# Patient Record
Sex: Female | Born: 1990 | Race: Black or African American | Hispanic: No | Marital: Single | Smoking: Current every day smoker
Health system: Southern US, Community
[De-identification: ages and names within clinical notes are randomized; demographics above are authoritative.]

## PROBLEM LIST (undated history)

## (undated) DIAGNOSIS — N921 Excessive and frequent menstruation with irregular cycle: Principal | ICD-10-CM

## (undated) DIAGNOSIS — Z8619 Personal history of other infectious and parasitic diseases: Secondary | ICD-10-CM

## (undated) DIAGNOSIS — Z3046 Encounter for surveillance of implantable subdermal contraceptive: Principal | ICD-10-CM

## (undated) DIAGNOSIS — Z309 Encounter for contraceptive management, unspecified: Secondary | ICD-10-CM

## (undated) HISTORY — DX: Encounter for contraceptive management, unspecified: Z30.9

## (undated) HISTORY — PX: WISDOM TOOTH EXTRACTION: SHX21

## (undated) HISTORY — DX: Excessive and frequent menstruation with irregular cycle: N92.1

## (undated) HISTORY — DX: Personal history of other infectious and parasitic diseases: Z86.19

## (undated) HISTORY — DX: Encounter for surveillance of implantable subdermal contraceptive: Z30.46

---
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
## 2013-11-28 ENCOUNTER — Emergency Department (HOSPITAL_COMMUNITY)
Admission: EM | Admit: 2013-11-28 | Discharge: 2013-11-28 | Disposition: A | Discharge status: Home / Self Care | Payer: 59 | Attending: Emergency Medicine | Admitting: Emergency Medicine

## 2013-11-28 ENCOUNTER — Encounter (HOSPITAL_COMMUNITY): Payer: Self-pay | Admitting: Emergency Medicine

## 2013-11-28 DIAGNOSIS — L259 Unspecified contact dermatitis, unspecified cause: Secondary | ICD-10-CM | POA: Insufficient documentation

## 2013-11-28 DIAGNOSIS — L309 Dermatitis, unspecified: Secondary | ICD-10-CM

## 2013-11-28 MED ORDER — PREDNISONE 10 MG PO TABS: ORAL_TABLET | ORAL | Status: DC

## 2013-11-28 MED ORDER — HYDROXYZINE HCL 25 MG PO TABS: 25.0000 mg | ORAL_TABLET | Freq: Four times a day (QID) | ORAL | Status: DC

## 2013-11-28 NOTE — ED Provider Notes (Signed)
CSN: [id number]     Arrival date & time 11/28/13  1648 History   First MD Initiated Contact with Patient 11/28/13 1737     Chief Complaint  Patient presents with  . Rash   (Consider location/radiation/quality/duration/timing/severity/associated sxs/prior Treatment) Patient is a 23 y.o. female presenting with rash. The history is provided by the patient.  Rash Location:  Face, head/neck, shoulder/arm and torso Head/neck rash location:  L neck and R neck Facial rash location:  Face Shoulder/arm rash location:  L axilla and R axilla Torso rash location:  L chest, R chest and upper back Quality: dryness, redness and scaling   Quality: not weeping   Severity:  Moderate Onset quality:  Gradual Duration:  3 weeks Timing:  Intermittent Progression:  Worsening Context: not chemical exposure, not hot tub use, not medications and not new detergent/soap   Relieved by:  Nothing Ineffective treatments:  None tried Associated symptoms: no abdominal pain, no joint pain, no shortness of breath and not wheezing     History reviewed. No pertinent past medical history. History reviewed. No pertinent past surgical history. Family History  Problem Relation Age of Onset  . Asthma Mother    History  Substance Use Topics  . Smoking status: Never Smoker   . Smokeless tobacco: Never Used  . Alcohol Use: No   OB History   Grav Para Term Preterm Abortions TAB SAB Ect Mult Living   1 1 1       1      Review of Systems  Constitutional: Negative for activity change.       All ROS Neg except as noted in HPI  HENT: Negative for nosebleeds.   Eyes: Negative for photophobia and discharge.  Respiratory: Negative for cough, shortness of breath and wheezing.   Cardiovascular: Negative for chest pain and palpitations.  Gastrointestinal: Negative for abdominal pain and blood in stool.  Genitourinary: Negative for dysuria, frequency and hematuria.  Musculoskeletal: Negative for arthralgias, back pain and  neck pain.  Skin: Positive for rash.  Neurological: Negative for dizziness, seizures and speech difficulty.  Psychiatric/Behavioral: Negative for hallucinations and confusion.    Allergies  Review of patient's allergies indicates no known allergies.  Home Medications  No current outpatient prescriptions on file. BP 121/71  Pulse 90  Temp(Src) 98 F (36.7 C)  Resp 20  Ht 5\' 6"  (1.676 m)  Wt 114 lb (51.71 kg)  BMI 18.41 kg/m2  SpO2 100% Physical Exam  Nursing note and vitals reviewed. Constitutional: She is oriented to person, place, and time. She appears well-developed and well-nourished.  Non-toxic appearance.  HENT:  Head: Normocephalic.  Right Ear: Tympanic membrane and external ear normal.  Left Ear: Tympanic membrane and external ear normal.  Eyes: EOM and lids are normal. Pupils are equal, round, and reactive to light.  Neck: Normal range of motion. Neck supple. Carotid bruit is not present.  Cardiovascular: Normal rate, regular rhythm, normal heart sounds, intact distal pulses and normal pulses.   Pulmonary/Chest: Breath sounds normal. No respiratory distress.  Abdominal: Soft. Bowel sounds are normal. There is no tenderness. There is no guarding.  Musculoskeletal: Normal range of motion.  Lymphadenopathy:       Head (right side): No submandibular adenopathy present.       Head (left side): No submandibular adenopathy present.    She has no cervical adenopathy.  Neurological: She is alert and oriented to person, place, and time. She has normal strength. No cranial nerve deficit or sensory deficit.  Skin: Skin is warm and dry.  There is a red dry scaling rash involving the antecubital areas of the right and left upper extremity. There is also a similar rash behind the left knee. There is also read dry scaling a rash involving the neck, chest, abdomen, and face. There is no red streaking appreciated. Is no hot areas appreciated. No drainage appreciated.  Psychiatric: She  has a normal mood and affect. Her speech is normal.    ED Course  Procedures (including critical care time) Labs Review Labs Reviewed - No data to display Imaging Review No results found.  EKG Interpretation   None       MDM  No diagnosis found. *I have reviewed nursing notes, vital signs, and all appropriate lab and imaging results for this patient.** Exam is consistent with eczema.  Rx for vistaril and prednisone given.  Pt referred to Dr hall for evaluation if not improving.  Kathie Dike, PA-C 11/29/13 [phone]

## 2013-11-28 NOTE — Discharge Instructions (Signed)
Please use the prednisone taper as prescribed. May use Vistaril at bedtime for itching, or every 6 hours if needed for more severe itching. Please see the dermatology specialist listed above if not improving. Eczema Eczema, also called atopic dermatitis, is a skin disorder that causes inflammation of the skin. It causes a red rash and dry, scaly skin. The skin becomes very itchy. Eczema is generally worse during the cooler winter months and often improves with the warmth of summer. Eczema usually starts showing signs in infancy. Some children outgrow eczema, but it may last through adulthood.  CAUSES  The exact cause of eczema is not known, but it appears to run in families. People with eczema often have a family history of eczema, allergies, asthma, or hay fever. Eczema is not contagious. Flare-ups of the condition may be caused by:   Contact with something you are sensitive or allergic to.   Stress. SIGNS AND SYMPTOMS  Dry, scaly skin.   Red, itchy rash.   Itchiness. This may occur before the skin rash and may be very intense.  DIAGNOSIS  The diagnosis of eczema is usually made based on symptoms and medical history. TREATMENT  Eczema cannot be cured, but symptoms usually can be controlled with treatment and other strategies. A treatment plan might include:  Controlling the itching and scratching.   Use over-the-counter antihistamines as directed for itching. This is especially useful at night when the itching tends to be worse.   Use over-the-counter steroid creams as directed for itching.   Avoid scratching. Scratching makes the rash and itching worse. It may also result in a skin infection (impetigo) due to a break in the skin caused by scratching.   Keeping the skin well moisturized with creams every day. This will seal in moisture and help prevent dryness. Lotions that contain alcohol and water should be avoided because they can dry the skin.   Limiting exposure to  things that you are sensitive or allergic to (allergens).   Recognizing situations that cause stress.   Developing a plan to manage stress.  HOME CARE INSTRUCTIONS   Only take over-the-counter or prescription medicines as directed by your health care provider.   Do not use anything on the skin without checking with your health care provider.   Keep baths or showers short (5 minutes) in warm (not hot) water. Use mild cleansers for bathing. These should be unscented. You may add nonperfumed bath oil to the bath water. It is best to avoid soap and bubble bath.   Immediately after a bath or shower, when the skin is still damp, apply a moisturizing ointment to the entire body. This ointment should be a petroleum ointment. This will seal in moisture and help prevent dryness. The thicker the ointment, the better. These should be unscented.   Keep fingernails cut short. Children with eczema may need to wear soft gloves or mittens at night after applying an ointment.   Dress in clothes made of cotton or cotton blends. Dress lightly, because heat increases itching.   A child with eczema should stay away from anyone with fever blisters or cold sores. The virus that causes fever blisters (herpes simplex) can cause a serious skin infection in children with eczema. SEEK MEDICAL CARE IF:   Your itching interferes with sleep.   Your rash gets worse or is not better within 1 week after starting treatment.   You see pus or soft yellow scabs in the rash area.   You have a  fever.   You have a rash flare-up after contact with someone who has fever blisters.  Document Released: 10/26/2000 Document Revised: 08/19/2013 Document Reviewed: 06/01/2013 [facility] Patient Information 2014 [place], [place].

## 2013-11-28 NOTE — ED Notes (Signed)
Rash on upper body and facial area for 3 weeks

## 2013-11-30 NOTE — ED Provider Notes (Signed)
Medical screening examination/treatment/procedure(s) were performed by non-physician practitioner and as supervising physician I was immediately available for consultation/collaboration.  EKG Interpretation   None        Zeppelin Commisso, MD 11/30/13 2229 

## 2014-06-14 ENCOUNTER — Telehealth: Payer: Self-pay | Admitting: Adult Health

## 2014-06-14 NOTE — Telephone Encounter (Signed)
Left message to make appt to be seen 

## 2014-06-14 NOTE — Telephone Encounter (Signed)
Pt states she had a Implanon inserted here in early 2013 and has had irregular bleeding every since then, she has now been on her period about 2 weeks and would like to know if we would RX a medication to help stop the bleeding.  Please advise.

## 2014-06-15 ENCOUNTER — Encounter: Payer: Self-pay | Admitting: Advanced Practice Midwife

## 2014-06-15 ENCOUNTER — Ambulatory Visit (INDEPENDENT_AMBULATORY_CARE_PROVIDER_SITE_OTHER): Payer: BC Managed Care – PPO | Admitting: Advanced Practice Midwife

## 2014-06-15 VITALS — BP 110/72 | Ht 66.0 in | Wt 105.0 lb

## 2014-06-15 DIAGNOSIS — N921 Excessive and frequent menstruation with irregular cycle: Secondary | ICD-10-CM

## 2014-06-15 DIAGNOSIS — Z309 Encounter for contraceptive management, unspecified: Secondary | ICD-10-CM

## 2014-06-15 DIAGNOSIS — Z975 Presence of (intrauterine) contraceptive device: Principal | ICD-10-CM

## 2014-06-15 MED ORDER — MEGESTROL ACETATE 40 MG PO TABS: ORAL_TABLET | ORAL | Status: DC

## 2014-06-15 NOTE — Progress Notes (Signed)
[facility] Visit  Patient name: Shelia Brown Witman MRN [id number]  Date of birth: 07/01/1991  CC & HPI:  Shelia Brown Krakowski is Brown 23 y.o. African American female presenting today for break through bleeding off and on since getting the Nexplanon 2 years ago. It is sometimes heavy, sometimes light.  Her friend takes megace prn and she would like that.  Pertinent History Reviewed:  Medical & Surgical Hx:   History reviewed. No pertinent past medical history. History reviewed. No pertinent past surgical history. Medications: Reviewed & Updated - see associated section Social History: Reviewed -  reports that she has been smoking Cigarettes.  She has been smoking about 0.00 packs per day. She has never used smokeless tobacco.  Objective Findings:  Vitals: BP 110/72  Ht 5\' 6"  (1.676 m)  Wt 105 lb (47.628 kg)  BMI 16.96 kg/m2  Physical Examination: General appearance - alert, well appearing, and in no distress Mental status - alert, oriented to person, place, and time  No results found for this or any previous visit (from the past 24 hour(s)).   Assessment & Plan:  Brown:   BTB on Nexplanon P:  Megace PRN   F/U prn   CRESENZO-DISHMAN,Knut Rondinelli CNM 06/15/2014 2:54 PM

## 2014-09-13 ENCOUNTER — Encounter: Payer: Self-pay | Admitting: Advanced Practice Midwife

## 2014-09-28 ENCOUNTER — Telehealth: Payer: Self-pay | Admitting: Adult Health

## 2014-09-28 NOTE — Telephone Encounter (Signed)
Having bleeding with nexplanon will come in 11/20 at 8:30 am

## 2014-09-28 NOTE — Telephone Encounter (Signed)
Pt states been taking the Megace for abnormal vaginal bleeding with the Nexplanon, now out of Megace and heavy vaginal bleeding started again 4 days ago. Pt states does she need to get another RX for the Megace or go ahead and have the Nexplanon removed, due to be removed in February (3 years since inserted).

## 2014-10-01 ENCOUNTER — Encounter: Payer: Self-pay | Admitting: Adult Health

## 2014-10-01 ENCOUNTER — Ambulatory Visit (INDEPENDENT_AMBULATORY_CARE_PROVIDER_SITE_OTHER): Payer: BC Managed Care – PPO | Admitting: Adult Health

## 2014-10-01 VITALS — BP 150/80 | Ht 66.0 in | Wt 112.0 lb

## 2014-10-01 DIAGNOSIS — Z975 Presence of (intrauterine) contraceptive device: Secondary | ICD-10-CM

## 2014-10-01 DIAGNOSIS — N921 Excessive and frequent menstruation with irregular cycle: Secondary | ICD-10-CM

## 2014-10-01 MED ORDER — MEGESTROL ACETATE 40 MG PO TABS
ORAL_TABLET | ORAL | Status: DC
Start: 2014-10-01 — End: 2015-01-06

## 2014-10-01 NOTE — Progress Notes (Signed)
Subjective:     Patient ID: Shelia Brown, female   DOB: [birth year], 22 y.o.   MRN: [id number]  HPI Shelia Brown is a 23 year old black female in complaining of bleeding with nexplanon, has had nexplanon since 2013.  Review of Systems See HPI Reviewed past medical,surgical, social and family history. Reviewed medications and allergies.     Objective:   Physical Exam BP 150/80 mmHg  Ht 5\' 6"  (1.676 m)  Wt 112 lb (50.803 kg)  BMI 18.09 kg/m2 Skin warm and dry.Pelvic: external genitalia is normal in appearance, vagina: period like blood, no odor cervix:smooth and bulbous, uterus: normal size, shape and contour, non tender, no masses felt, adnexa: no masses or tenderness noted.  GC/CHL obtained.    Discussed options not sure what she wants when time to remove nexplanon, will give megace to stop bleeding.  Assessment:     BTB with nexplanon    Plan:    Return in 3 months for nexplanon removal or before if has made choice GC/CHL sent Rx megace 40 mg #45 3 x 5 days then 2 x 5 days then 1 daily with 3 refills   Review handout on birth control options

## 2014-10-01 NOTE — Patient Instructions (Signed)
Take megace  follow up in 3 months for nexplanon

## 2014-10-02 LAB — GC/CHLAMYDIA PROBE AMP
CT Probe RNA: POSITIVE — AB
GC PROBE AMP APTIMA: NEGATIVE

## 2014-10-04 ENCOUNTER — Telehealth: Payer: Self-pay | Admitting: Adult Health

## 2014-10-04 NOTE — Telephone Encounter (Signed)
Left message to call about test results

## 2014-10-05 ENCOUNTER — Telehealth: Payer: Self-pay | Admitting: Adult Health

## 2014-10-05 MED ORDER — AZITHROMYCIN 500 MG PO TABS: ORAL_TABLET | ORAL | Status: DC

## 2014-10-05 NOTE — Telephone Encounter (Signed)
Pt aware of +CHlamydia, treated with azithromycin 500 mg #2 2 po now for pt and partner Nanda QuintonChris Dejounette was treated too, for POT 12/22 at 8:30 am [facility]DR[facility] sent

## 2014-11-02 ENCOUNTER — Ambulatory Visit: Payer: BC Managed Care – PPO | Admitting: Adult Health

## 2015-01-06 ENCOUNTER — Encounter: Payer: Self-pay | Admitting: *Deleted

## 2015-01-06 ENCOUNTER — Ambulatory Visit (INDEPENDENT_AMBULATORY_CARE_PROVIDER_SITE_OTHER): Payer: BLUE CROSS/BLUE SHIELD | Admitting: Adult Health

## 2015-01-06 ENCOUNTER — Encounter: Payer: Self-pay | Admitting: Adult Health

## 2015-01-06 ENCOUNTER — Ambulatory Visit (INDEPENDENT_AMBULATORY_CARE_PROVIDER_SITE_OTHER): Payer: BLUE CROSS/BLUE SHIELD | Admitting: *Deleted

## 2015-01-06 VITALS — BP 118/80 | HR 90 | Ht 66.0 in | Wt 112.0 lb

## 2015-01-06 DIAGNOSIS — Z309 Encounter for contraceptive management, unspecified: Secondary | ICD-10-CM

## 2015-01-06 DIAGNOSIS — Z30013 Encounter for initial prescription of injectable contraceptive: Secondary | ICD-10-CM

## 2015-01-06 DIAGNOSIS — Z3049 Encounter for surveillance of other contraceptives: Secondary | ICD-10-CM

## 2015-01-06 DIAGNOSIS — Z3042 Encounter for surveillance of injectable contraceptive: Secondary | ICD-10-CM

## 2015-01-06 DIAGNOSIS — Z3046 Encounter for surveillance of implantable subdermal contraceptive: Secondary | ICD-10-CM

## 2015-01-06 DIAGNOSIS — Z3202 Encounter for pregnancy test, result negative: Secondary | ICD-10-CM

## 2015-01-06 DIAGNOSIS — Z8619 Personal history of other infectious and parasitic diseases: Secondary | ICD-10-CM

## 2015-01-06 HISTORY — DX: Encounter for surveillance of implantable subdermal contraceptive: Z30.46

## 2015-01-06 HISTORY — DX: Encounter for contraceptive management, unspecified: Z30.9

## 2015-01-06 HISTORY — DX: Personal history of other infectious and parasitic diseases: Z86.19

## 2015-01-06 LAB — POCT URINE PREGNANCY: PREG TEST UR: NEGATIVE

## 2015-01-06 MED ORDER — MEDROXYPROGESTERONE ACETATE 150 MG/ML IM SUSP
150.0000 mg | Freq: Once | INTRAMUSCULAR | Status: AC
  Administered 2015-01-06: 150 mg via INTRAMUSCULAR

## 2015-01-06 MED ORDER — MEDROXYPROGESTERONE ACETATE 150 MG/ML IM SUSP: 150.0000 mg | INTRAMUSCULAR | Status: DC

## 2015-01-06 NOTE — Progress Notes (Addendum)
Subjective:     Patient ID: Shelia Brown, female   DOB: 1990-12-04, 24 y.o.   MRN: [id number]  HPI Rolla EtienneBeate is a 24 year old black female in for nexplanon removal and proof of treatment for chlamydia and to talk birth control and she wants depo provera.She took meds for Chlamydia and has not had sex since November and actually broke up with boyfriend.  Review of Systems Patient denies any headaches, hearing loss, fatigue, blurred vision, shortness of breath, chest pain, abdominal pain, problems with bowel movements, urination, or intercourse. No joint pain or mood swings. Reviewed past medical,surgical, social and family history. Reviewed medications and allergies.     Objective:   Physical Exam BP 118/80 mmHg  Pulse 90  Ht 5\' 6"  (1.676 m)  Wt 112 lb (50.803 kg)  BMI 18.09 kg/m2  LMP 12/30/2014 UPT negative, consent signed, time out called, and left arm cleansed with betadine, and injected with 1.5 cc 2% lidocaine and waited til numb.Under sterile technique a #11 blade was used to make small vertical incision, and a curved forceps was used to easily remove rod. Steri strips applied. Pressure dressing applied.   GC/CHL sent on urine for proof of treatment. Discussed depo provera and she wants to get today, face time 15 minutes with pt with 50% taking about depo and chlamydia.  Assessment:     Nexplanon removed Proof of treatment recent Chlamydia Contraceptive management    Plan:     GC/CHL sent Rx Depo provera 150 mg, disp.# 1 vail for IM injection every 3 months in office with 4 refills, go get and bring to office this pm for first injection, review handout on depo Use condoms, keep clean and dry x 24 hours, no heavy lifting, keep steri strips on x 72 hours, Keep pressure dressing on x 24 hours.  Follow up prn problems.

## 2015-01-06 NOTE — Patient Instructions (Signed)
Use condoms x 2 weeks, keep clean and dry x 24 hours, no heavy lifting, keep steri strips on x 72 hours, Keep pressure dressing on x 24 hours. Follow up prn problems. Go get depo and return  Medroxyprogesterone injection [Contraceptive] What is this medicine? MEDROXYPROGESTERONE (me DROX ee proe JES te rone) contraceptive injections prevent pregnancy. They provide effective birth control for 3 months. Depo-subQ Provera 104 is also used for treating pain related to endometriosis. This medicine may be used for other purposes; ask your health care provider or pharmacist if you have questions. COMMON BRAND NAME(S): Depo-Provera, Depo-subQ Provera 104 What should I tell my health care provider before I take this medicine? They need to know if you have any of these conditions: -frequently drink alcohol -asthma -blood vessel disease or a history of a blood clot in the lungs or legs -bone disease such as osteoporosis -breast cancer -diabetes -eating disorder (anorexia nervosa or bulimia) -high blood pressure -HIV infection or AIDS -kidney disease -liver disease -mental depression -migraine -seizures (convulsions) -stroke -tobacco smoker -vaginal bleeding -an unusual or allergic reaction to medroxyprogesterone, other hormones, medicines, foods, dyes, or preservatives -pregnant or trying to get pregnant -breast-feeding How should I use this medicine? Depo-Provera Contraceptive injection is given into a muscle. Depo-subQ Provera 104 injection is given under the skin. These injections are given by a health care professional. You must not be pregnant before getting an injection. The injection is usually given during the first 5 days after the start of a menstrual period or 6 weeks after delivery of a baby. Talk to your pediatrician regarding the use of this medicine in children. Special care may be needed. These injections have been used in female children who have started having menstrual  periods. Overdosage: If you think you have taken too much of this medicine contact a poison control center or emergency room at once. NOTE: This medicine is only for you. Do not share this medicine with others. What if I miss a dose? Try not to miss a dose. You must get an injection once every 3 months to maintain birth control. If you cannot keep an appointment, call and reschedule it. If you wait longer than 13 weeks between Depo-Provera contraceptive injections or longer than 14 weeks between Depo-subQ Provera 104 injections, you could get pregnant. Use another method for birth control if you miss your appointment. You may also need a pregnancy test before receiving another injection. What may interact with this medicine? Do not take this medicine with any of the following medications: -bosentan This medicine may also interact with the following medications: -aminoglutethimide -antibiotics or medicines for infections, especially rifampin, rifabutin, rifapentine, and griseofulvin -aprepitant -barbiturate medicines such as phenobarbital or primidone -bexarotene -carbamazepine -medicines for seizures like ethotoin, felbamate, oxcarbazepine, phenytoin, topiramate -modafinil -St. John's wort This list may not describe all possible interactions. Give your health care provider a list of all the medicines, herbs, non-prescription drugs, or dietary supplements you use. Also tell them if you smoke, drink alcohol, or use illegal drugs. Some items may interact with your medicine. What should I watch for while using this medicine? This drug does not protect you against HIV infection (AIDS) or other sexually transmitted diseases. Use of this product may cause you to lose calcium from your bones. Loss of calcium may cause weak bones (osteoporosis). Only use this product for more than 2 years if other forms of birth control are not right for you. The longer you use this product for birth  control the more  likely you will be at risk for weak bones. Ask your health care professional how you can keep strong bones. You may have a change in bleeding pattern or irregular periods. Many females stop having periods while taking this drug. If you have received your injections on time, your chance of being pregnant is very low. If you think you may be pregnant, see your health care professional as soon as possible. Tell your health care professional if you want to get pregnant within the next year. The effect of this medicine may last a long time after you get your last injection. What side effects may I notice from receiving this medicine? Side effects that you should report to your doctor or health care professional as soon as possible: -allergic reactions like skin rash, itching or hives, swelling of the face, lips, or tongue -breast tenderness or discharge -breathing problems -changes in vision -depression -feeling faint or lightheaded, falls -fever -pain in the abdomen, chest, groin, or leg -problems with balance, talking, walking -unusually weak or tired -yellowing of the eyes or skin Side effects that usually do not require medical attention (report to your doctor or health care professional if they continue or are bothersome): -acne -fluid retention and swelling -headache -irregular periods, spotting, or absent periods -temporary pain, itching, or skin reaction at site where injected -weight gain This list may not describe all possible side effects. Call your doctor for medical advice about side effects. You may report side effects to FDA at 1-800-FDA-1088. Where should I keep my medicine? This does not apply. The injection will be given to you by a health care professional. NOTE: This sheet is a summary. It may not cover all possible information. If you have questions about this medicine, talk to your doctor, pharmacist, or health care provider.  2015, Elsevier/Gold Standard. (2008-11-19  18:37:56)

## 2015-01-06 NOTE — Progress Notes (Signed)
Pt here for Depo shot. Pt had Implanon removed earlier today and is having pain in that arm. Advised it should get better, but if not, call us back. Pt had negative urine pregnancy test at earlier appt.  Return in 12 weeks for next shot. JSY

## 2015-01-07 LAB — GC/CHLAMYDIA PROBE AMP
CHLAMYDIA, DNA PROBE: NEGATIVE
Neisseria gonorrhoeae by PCR: NEGATIVE

## 2015-03-16 ENCOUNTER — Other Ambulatory Visit: Payer: Self-pay | Admitting: Adult Health

## 2015-03-28 ENCOUNTER — Ambulatory Visit: Payer: BLUE CROSS/BLUE SHIELD | Admitting: Adult Health

## 2015-03-31 ENCOUNTER — Ambulatory Visit: Payer: BLUE CROSS/BLUE SHIELD

## 2015-03-31 ENCOUNTER — Encounter: Payer: Self-pay | Admitting: Adult Health

## 2015-03-31 ENCOUNTER — Ambulatory Visit (INDEPENDENT_AMBULATORY_CARE_PROVIDER_SITE_OTHER): Payer: BLUE CROSS/BLUE SHIELD | Admitting: Adult Health

## 2015-03-31 VITALS — BP 102/60 | HR 80 | Ht 66.0 in | Wt 109.5 lb

## 2015-03-31 DIAGNOSIS — Z3202 Encounter for pregnancy test, result negative: Secondary | ICD-10-CM

## 2015-03-31 DIAGNOSIS — N921 Excessive and frequent menstruation with irregular cycle: Secondary | ICD-10-CM | POA: Diagnosis not present

## 2015-03-31 DIAGNOSIS — Z3042 Encounter for surveillance of injectable contraceptive: Secondary | ICD-10-CM | POA: Diagnosis not present

## 2015-03-31 HISTORY — DX: Excessive and frequent menstruation with irregular cycle: N92.1

## 2015-03-31 LAB — POCT URINE PREGNANCY: Preg Test, Ur: NEGATIVE

## 2015-03-31 MED ORDER — MEGESTROL ACETATE 40 MG PO TABS: 40.0000 mg | ORAL_TABLET | Freq: Every day | ORAL | Status: DC

## 2015-03-31 MED ORDER — MEDROXYPROGESTERONE ACETATE 150 MG/ML IM SUSP
150.0000 mg | Freq: Once | INTRAMUSCULAR | Status: AC
  Administered 2015-03-31: 150 mg via INTRAMUSCULAR

## 2015-03-31 NOTE — Progress Notes (Signed)
Subjective:     Patient ID: Georgiann CockerBeate A Madry, female   DOB: 1990/12/17, 24 y.o.   MRN: [id number]  HPI Rolla EtienneBeate is a 24 year old black female in complaining of irregular bleeding with depo, she had with nexplanon too.If she takes megace it stops, and she needs refills.No sex in > 6 months,had negative GC/CHL in February.  Review of Systems + irregular bleeding, all other systems negative Reviewed past medical,surgical, social and family history. Reviewed medications and allergies.     Objective:   Physical Exam BP 102/60 mmHg  Pulse 80  Ht 5\' 6"  (1.676 m)  Wt 109 lb 8 oz (49.669 kg)  BMI 17.68 kg/m2   UPT negative, Talk only, not unusual to have irregular bleeding with depo, will give depo today and will Rx megace. Depo 150 mg IM by Eligah EastJ Young LPN.  Assessment:     Irregular bleeding with depo Contraceptive management    Plan:    Use condoms if has sex Rx megace 40 mg #30 1 daily to stop bleeding, with 3 refills Return in 12 Weeks for next depo

## 2015-03-31 NOTE — Patient Instructions (Signed)
Depo in 12 weeks  Take megace 1 daily

## 2015-06-23 ENCOUNTER — Ambulatory Visit: Payer: BLUE CROSS/BLUE SHIELD

## 2015-06-23 ENCOUNTER — Encounter: Payer: Self-pay | Admitting: Obstetrics & Gynecology

## 2016-01-30 ENCOUNTER — Other Ambulatory Visit: Payer: Self-pay | Admitting: Adult Health

## 2016-01-31 ENCOUNTER — Other Ambulatory Visit (HOSPITAL_COMMUNITY)
Admission: RE | Admit: 2016-01-31 | Discharge: 2016-01-31 | Disposition: A | Discharge status: Home / Self Care | Payer: BLUE CROSS/BLUE SHIELD | Source: Ambulatory Visit | Attending: Adult Health | Admitting: Adult Health

## 2016-01-31 ENCOUNTER — Encounter: Payer: Self-pay | Admitting: Adult Health

## 2016-01-31 ENCOUNTER — Ambulatory Visit (INDEPENDENT_AMBULATORY_CARE_PROVIDER_SITE_OTHER): Payer: BLUE CROSS/BLUE SHIELD | Admitting: Adult Health

## 2016-01-31 VITALS — BP 100/78 | HR 84 | Ht 66.5 in | Wt 106.0 lb

## 2016-01-31 DIAGNOSIS — Z113 Encounter for screening for infections with a predominantly sexual mode of transmission: Secondary | ICD-10-CM | POA: Insufficient documentation

## 2016-01-31 DIAGNOSIS — Z01419 Encounter for gynecological examination (general) (routine) without abnormal findings: Secondary | ICD-10-CM | POA: Insufficient documentation

## 2016-01-31 DIAGNOSIS — Z30013 Encounter for initial prescription of injectable contraceptive: Secondary | ICD-10-CM

## 2016-01-31 DIAGNOSIS — Z3202 Encounter for pregnancy test, result negative: Secondary | ICD-10-CM | POA: Diagnosis not present

## 2016-01-31 LAB — POCT URINE PREGNANCY: Preg Test, Ur: NEGATIVE

## 2016-01-31 MED ORDER — MEDROXYPROGESTERONE ACETATE 150 MG/ML IM SUSP: INTRAMUSCULAR | Status: DC

## 2016-01-31 NOTE — Patient Instructions (Signed)
No sex  Call with period for first depo Physical in 1 year  Labs in near future

## 2016-01-31 NOTE — Progress Notes (Signed)
Patient ID: Georgiann CockerBeate A Hamberger, female   DOB: [birth year], 25 y.o.   MRN: [id number] History of Present Illness: Rolla EtienneBeate is a 25 year old black female, in for well woman gyn exam,and pap.She has been off depo since May and wants to resume it.Last sex in January. 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 She has some BTB at times and takes megace.  Current Medications, Allergies, Past Medical History, Past Surgical History, Family History and Social History were reviewed in [facility] electronic medical record.     Review of Systems: Patient denies any headaches, hearing loss, fatigue, blurred vision, shortness of breath, chest pain, abdominal pain, problems with bowel movements, urination, or intercourse. No joint pain or mood swings.See HPI for positives.    Physical Exam:BP 100/78 mmHg  Pulse 84  Ht 5' 6.5" (1.689 m)  Wt 106 lb (48.081 kg)  BMI 16.85 kg/m2  LMP 01/15/2016 (Approximate) UPT negative. General:  Well developed, well nourished, no acute distress Skin:  Warm and dry, has several tattoos Neck:  Midline trachea, normal thyroid, good ROM, no lymphadenopathy Lungs; Clear to auscultation bilaterally Breast:  No dominant palpable mass, retraction, or nipple discharge Cardiovascular: Regular rate and rhythm Abdomen:  Soft, non tender, no hepatosplenomegaly Pelvic:  External genitalia is normal in appearance, no lesions.  The vagina is normal in appearance. Urethra has no lesions or masses. The cervix is smooth, pap with G/CHL perfomred.  Uterus is felt to be normal size, shape, and contour.  No adnexal masses or tenderness noted.Bladder is non tender, no masses felt. Extremities/musculoskeletal:  No swelling or varicosities noted, no clubbing or cyanosis Psych:  No mood changes, alert and cooperative,seems happy   Impression: Well woman gyn exam and pap Contraceptive management    Plan:  No sex. Call with next period for first depo Rx Depo provera 150 mg, disp.# 1 vail for IM  injection every 3 months in office, with 4 refills Physical in 1 year, pap in 3 if normal Labs in near future

## 2016-02-02 LAB — CYTOLOGY - PAP

## 2016-02-03 ENCOUNTER — Encounter: Payer: Self-pay | Admitting: *Deleted

## 2016-02-03 ENCOUNTER — Ambulatory Visit (INDEPENDENT_AMBULATORY_CARE_PROVIDER_SITE_OTHER): Payer: BLUE CROSS/BLUE SHIELD | Admitting: *Deleted

## 2016-02-03 DIAGNOSIS — Z3202 Encounter for pregnancy test, result negative: Secondary | ICD-10-CM

## 2016-02-03 DIAGNOSIS — Z3042 Encounter for surveillance of injectable contraceptive: Secondary | ICD-10-CM | POA: Diagnosis not present

## 2016-02-03 LAB — POCT URINE PREGNANCY: PREG TEST UR: NEGATIVE

## 2016-02-03 MED ORDER — MEDROXYPROGESTERONE ACETATE 150 MG/ML IM SUSP
150.0000 mg | Freq: Once | INTRAMUSCULAR | Status: AC
  Administered 2016-02-03: 150 mg via INTRAMUSCULAR

## 2016-02-03 NOTE — Progress Notes (Signed)
Patient ID: Shelia Brown, female   DOB: 09-Feb-1991, 25 y.o.   MRN: [id number] Depo Provera 150 mg IM given left deltoid with no complication. Pregnancy test negative and pt states started her period this week. Pt to return in 12 weeks for next injection.

## 2016-04-17 ENCOUNTER — Ambulatory Visit: Payer: BLUE CROSS/BLUE SHIELD

## 2016-04-27 ENCOUNTER — Ambulatory Visit (INDEPENDENT_AMBULATORY_CARE_PROVIDER_SITE_OTHER): Payer: BLUE CROSS/BLUE SHIELD | Admitting: *Deleted

## 2016-04-27 ENCOUNTER — Encounter: Payer: Self-pay | Admitting: *Deleted

## 2016-04-27 DIAGNOSIS — Z3202 Encounter for pregnancy test, result negative: Secondary | ICD-10-CM | POA: Diagnosis not present

## 2016-04-27 DIAGNOSIS — Z3042 Encounter for surveillance of injectable contraceptive: Secondary | ICD-10-CM

## 2016-04-27 LAB — POCT URINE PREGNANCY: Preg Test, Ur: NEGATIVE

## 2016-04-27 MED ORDER — MEDROXYPROGESTERONE ACETATE 150 MG/ML IM SUSP
150.0000 mg | Freq: Once | INTRAMUSCULAR | Status: AC
  Administered 2016-04-27: 150 mg via INTRAMUSCULAR

## 2016-04-27 NOTE — Progress Notes (Signed)
Patient ID: Shelia Brown, female   DOB: 02/14/1991, 25 y.o.   MRN: [id number] Depo Provera 150 mg IM injection given in right deltoid with no complications, negative pregnancy test. Pt to return in 12 weeks for next injection.

## 2016-07-20 ENCOUNTER — Ambulatory Visit: Payer: BLUE CROSS/BLUE SHIELD

## 2016-08-17 ENCOUNTER — Ambulatory Visit (INDEPENDENT_AMBULATORY_CARE_PROVIDER_SITE_OTHER): Payer: BLUE CROSS/BLUE SHIELD | Admitting: *Deleted

## 2016-08-17 ENCOUNTER — Encounter: Payer: Self-pay | Admitting: *Deleted

## 2016-08-17 DIAGNOSIS — Z3202 Encounter for pregnancy test, result negative: Secondary | ICD-10-CM

## 2016-08-17 DIAGNOSIS — Z308 Encounter for other contraceptive management: Secondary | ICD-10-CM

## 2016-08-17 LAB — POCT URINE PREGNANCY: PREG TEST UR: NEGATIVE

## 2016-08-17 MED ORDER — MEDROXYPROGESTERONE ACETATE 150 MG/ML IM SUSP: 150.0000 mg | Freq: Once | INTRAMUSCULAR | Status: DC

## 2016-08-17 NOTE — Progress Notes (Signed)
Pt here for Depo. Pt's last Depo was 04/27/16. Pt's Depo was due 9/1-9/15. Pt had sex last pm. I advised she can't have Depo today. Needs to have STAT quant in 10 days and as long as negative, can have Depo same day. NO SEX x 10 days!! Pt voiced understanding. JSY

## 2016-08-27 ENCOUNTER — Ambulatory Visit (INDEPENDENT_AMBULATORY_CARE_PROVIDER_SITE_OTHER): Payer: BLUE CROSS/BLUE SHIELD | Admitting: *Deleted

## 2016-08-27 ENCOUNTER — Other Ambulatory Visit: Payer: BLUE CROSS/BLUE SHIELD

## 2016-08-27 ENCOUNTER — Encounter: Payer: Self-pay | Admitting: *Deleted

## 2016-08-27 DIAGNOSIS — Z3042 Encounter for surveillance of injectable contraceptive: Secondary | ICD-10-CM

## 2016-08-27 DIAGNOSIS — Z3202 Encounter for pregnancy test, result negative: Secondary | ICD-10-CM | POA: Diagnosis not present

## 2016-08-27 DIAGNOSIS — Z308 Encounter for other contraceptive management: Secondary | ICD-10-CM

## 2016-08-27 LAB — POCT URINE PREGNANCY: Preg Test, Ur: NEGATIVE

## 2016-08-27 MED ORDER — MEDROXYPROGESTERONE ACETATE 150 MG/ML IM SUSP
150.0000 mg | Freq: Once | INTRAMUSCULAR | Status: AC
  Administered 2016-08-27: 150 mg via INTRAMUSCULAR

## 2016-08-27 NOTE — Progress Notes (Signed)
Depo Provera 150 mg IM given left deltoid with no complications, negative pregnancy test. Pt states started her period on Friday, 08/24/2016, late for Depo, ok to give per Genelle GatherJennifer Griffin,NP. Pt to return in 12 weeks for next injection.

## 2016-11-26 ENCOUNTER — Ambulatory Visit: Payer: BLUE CROSS/BLUE SHIELD

## 2017-08-26 ENCOUNTER — Encounter: Payer: Self-pay | Admitting: Adult Health

## 2017-08-26 ENCOUNTER — Ambulatory Visit (INDEPENDENT_AMBULATORY_CARE_PROVIDER_SITE_OTHER): Payer: BLUE CROSS/BLUE SHIELD | Admitting: Adult Health

## 2017-08-26 VITALS — BP 112/68 | HR 77 | Resp 18 | Ht 66.0 in | Wt 113.0 lb

## 2017-08-26 DIAGNOSIS — Z3A01 Less than 8 weeks gestation of pregnancy: Secondary | ICD-10-CM | POA: Insufficient documentation

## 2017-08-26 DIAGNOSIS — Z3201 Encounter for pregnancy test, result positive: Secondary | ICD-10-CM

## 2017-08-26 DIAGNOSIS — R112 Nausea with vomiting, unspecified: Secondary | ICD-10-CM | POA: Diagnosis not present

## 2017-08-26 DIAGNOSIS — N926 Irregular menstruation, unspecified: Secondary | ICD-10-CM | POA: Diagnosis not present

## 2017-08-26 DIAGNOSIS — O3680X0 Pregnancy with inconclusive fetal viability, not applicable or unspecified: Secondary | ICD-10-CM

## 2017-08-26 DIAGNOSIS — R197 Diarrhea, unspecified: Secondary | ICD-10-CM | POA: Diagnosis not present

## 2017-08-26 DIAGNOSIS — O219 Vomiting of pregnancy, unspecified: Secondary | ICD-10-CM | POA: Insufficient documentation

## 2017-08-26 LAB — POCT URINE PREGNANCY: PREG TEST UR: POSITIVE — AB

## 2017-08-26 MED ORDER — DOXYLAMINE-PYRIDOXINE ER 20-20 MG PO TBCR: 1.0000 | EXTENDED_RELEASE_TABLET | Freq: Two times a day (BID) | ORAL | 0 refills | Status: DC

## 2017-08-26 MED ORDER — OB COMPLETE PETITE 35-5-1-200 MG PO CAPS: ORAL_CAPSULE | ORAL | 12 refills | Status: DC

## 2017-08-26 NOTE — Progress Notes (Signed)
Subjective:     Patient ID: Shelia Brown, female   DOB: Mar 28, 1991, 26 y.o.   MRN: [id number]  HPI Shelia Brown is a 26 year old white female in for UPT, has missed a period and has nausea and vomiting, some cramps and diarrhea.   Review of Systems +missed period  +nausea and vomiting +cramps and diarrhea  Reviewed past medical,surgical, social and family history. Reviewed medications and allergies.     Objective:   Physical Exam BP 112/68 (BP Location: Left Arm, Patient Position: Sitting, Cuff Size: Normal)   Pulse 77   Resp 18   Ht  (1.676 m)   Wt 113 lb (51.3 kg)   LMP 07/13/2017   SpO2 99%   BMI 18.24 kg/m UPT +, about 6+2 weeks, by LMP with EDD 04/19/18. Skin warm and dry. Neck: mid line trachea, normal thyroid, good ROM, no lymphadenopathy noted. Lungs: clear to ausculation bilaterally. Cardiovascular: regular rate and rhythm.Abdomen is soft, slightly tender.    Assessment:     1. Pregnancy test positive   2. Less than [redacted] weeks gestation of pregnancy   3. Encounter to determine fetal viability of pregnancy, single or unspecified fetus   4. Nausea and vomiting during pregnancy prior to [redacted] weeks gestation       Plan:     Meds ordered this encounter  Medications  . Prenat-FeCbn-FeAspGl-FA-Omega (OB COMPLETE PETITE) 35-5-1-200 MG CAPS    Sig: Take 1 daily    Dispense:  30 capsule    Refill:  12    Order Specific Question:   Supervising Provider    Answer:   Despina Hidden, LUTHER H [[id number]]  . Doxylamine-Pyridoxine ER (BONJESTA) 20-20 MG TBCR    Sig: Take 1 capsule by mouth 2 (two) times daily at 8 am and 10 pm.    Dispense:  18 tablet    Refill:  0    Order Specific Question:   Supervising Provider    Answer:   Duane Lope H [[id number]]  LOT [id number], exp 11-11-17. Return in 1 week for dating [place] Eat often Review handouts on First trimester and by [facility]    Can take imodium if needed

## 2017-08-26 NOTE — Patient Instructions (Signed)
First Trimester of Pregnancy The first trimester of pregnancy is from week 1 until the end of week 13 (months 1 through 3). During this time, your baby will begin to develop inside you. At 6-8 weeks, the eyes and face are formed, and the heartbeat can be seen on ultrasound. At the end of 12 weeks, all the baby's organs are formed. Prenatal care is all the medical care you receive before the birth of your baby. Make sure you get good prenatal care and follow all of your doctor's instructions. Follow these instructions at home: Medicines  Take over-the-counter and prescription medicines only as told by your doctor. Some medicines are safe and some medicines are not safe during pregnancy.  Take a prenatal vitamin that contains at least 600 micrograms (mcg) of folic acid.  If you have trouble pooping (constipation), take medicine that will make your stool soft (stool softener) if your doctor approves. Eating and drinking  Eat regular, healthy meals.  Your doctor will tell you the amount of weight gain that is right for you.  Avoid raw meat and uncooked cheese.  If you feel sick to your stomach (nauseous) or throw up (vomit): ? Eat 4 or 5 small meals a day instead of 3 large meals. ? Try eating a few soda crackers. ? Drink liquids between meals instead of during meals.  To prevent constipation: ? Eat foods that are high in fiber, like fresh fruits and vegetables, whole grains, and beans. ? Drink enough fluids to keep your pee (urine) clear or pale yellow. Activity  Exercise only as told by your doctor. Stop exercising if you have cramps or pain in your lower belly (abdomen) or low back.  Do not exercise if it is too hot, too humid, or if you are in a place of great height (high altitude).  Try to avoid standing for long periods of time. Move your legs often if you must stand in one place for a long time.  Avoid heavy lifting.  Wear low-heeled shoes. Sit and stand up straight.  You  can have sex unless your doctor tells you not to. Relieving pain and discomfort  Wear a good support bra if your breasts are sore.  Take warm water baths (sitz baths) to soothe pain or discomfort caused by hemorrhoids. Use hemorrhoid cream if your doctor says it is okay.  Rest with your legs raised if you have leg cramps or low back pain.  If you have puffy, bulging veins (varicose veins) in your legs: ? Wear support hose or compression stockings as told by your doctor. ? Raise (elevate) your feet for 15 minutes, 3-4 times a day. ? Limit salt in your food. Prenatal care  Schedule your prenatal visits by the twelfth week of pregnancy.  Write down your questions. Take them to your prenatal visits.  Keep all your prenatal visits as told by your doctor. This is important. Safety  Wear your seat belt at all times when driving.  Make a list of emergency phone numbers. The list should include numbers for family, friends, the hospital, and police and fire departments. General instructions  Ask your doctor for a referral to a local prenatal class. Begin classes no later than at the start of month 6 of your pregnancy.  Ask for help if you need counseling or if you need help with nutrition. Your doctor can give you advice or tell you where to go for help.  Do not use hot tubs, steam rooms, or   saunas.  Do not douche or use tampons or scented sanitary pads.  Do not cross your legs for long periods of time.  Avoid all herbs and alcohol. Avoid drugs that are not approved by your doctor.  Do not use any tobacco products, including cigarettes, chewing tobacco, and electronic cigarettes. If you need help quitting, ask your doctor. You may get counseling or other support to help you quit.  Avoid cat litter boxes and soil used by cats. These carry germs that can cause birth defects in the baby and can cause a loss of your baby (miscarriage) or stillbirth.  Visit your dentist. At home, brush  your teeth with a soft toothbrush. Be gentle when you floss. Contact a doctor if:  You are dizzy.  You have mild cramps or pressure in your lower belly.  You have a nagging pain in your belly area.  You continue to feel sick to your stomach, you throw up, or you have watery poop (diarrhea).  You have a bad smelling fluid coming from your vagina.  You have pain when you pee (urinate).  You have increased puffiness (swelling) in your face, hands, legs, or ankles. Get help right away if:  You have a fever.  You are leaking fluid from your vagina.  You have spotting or bleeding from your vagina.  You have very bad belly cramping or pain.  You gain or lose weight rapidly.  You throw up blood. It may look like coffee grounds.  You are around people who have German measles, fifth disease, or chickenpox.  You have a very bad headache.  You have shortness of breath.  You have any kind of trauma, such as from a fall or a car accident. Summary  The first trimester of pregnancy is from week 1 until the end of week 13 (months 1 through 3).  To take care of yourself and your unborn baby, you will need to eat healthy meals, take medicines only if your doctor tells you to do so, and do activities that are safe for you and your baby.  Keep all follow-up visits as told by your doctor. This is important as your doctor will have to ensure that your baby is healthy and growing well. This information is not intended to replace advice given to you by your health care provider. Make sure you discuss any questions you have with your health care provider. Document Released: 04/16/2008 Document Revised: 11/06/2016 Document Reviewed: 11/06/2016 Elsevier Interactive Patient Education  2017 [employer].  

## 2017-09-02 ENCOUNTER — Ambulatory Visit (INDEPENDENT_AMBULATORY_CARE_PROVIDER_SITE_OTHER): Payer: BLUE CROSS/BLUE SHIELD

## 2017-09-02 DIAGNOSIS — O3680X0 Pregnancy with inconclusive fetal viability, not applicable or unspecified: Secondary | ICD-10-CM

## 2017-09-02 DIAGNOSIS — Z3A01 Less than 8 weeks gestation of pregnancy: Secondary | ICD-10-CM

## 2017-09-02 NOTE — Progress Notes (Signed)
US 7+2 wks,single IUP w/ys,positive fht 136 bpm,crl 10.27 mm,normal ovaries bilat,EDD 04/19/2018 by LMP

## 2017-09-16 ENCOUNTER — Encounter: Payer: BLUE CROSS/BLUE SHIELD | Admitting: Women's Health

## 2017-09-16 ENCOUNTER — Ambulatory Visit: Payer: BLUE CROSS/BLUE SHIELD | Admitting: *Deleted

## 2017-09-25 ENCOUNTER — Ambulatory Visit: Payer: BLUE CROSS/BLUE SHIELD | Admitting: *Deleted

## 2017-09-25 ENCOUNTER — Ambulatory Visit (INDEPENDENT_AMBULATORY_CARE_PROVIDER_SITE_OTHER): Payer: BLUE CROSS/BLUE SHIELD | Admitting: Women's Health

## 2017-09-25 ENCOUNTER — Encounter: Payer: Self-pay | Admitting: Women's Health

## 2017-09-25 VITALS — BP 120/62 | HR 74 | Wt 114.0 lb

## 2017-09-25 DIAGNOSIS — Z3481 Encounter for supervision of other normal pregnancy, first trimester: Secondary | ICD-10-CM | POA: Diagnosis not present

## 2017-09-25 DIAGNOSIS — Z3A10 10 weeks gestation of pregnancy: Secondary | ICD-10-CM | POA: Diagnosis not present

## 2017-09-25 DIAGNOSIS — F1290 Cannabis use, unspecified, uncomplicated: Secondary | ICD-10-CM | POA: Insufficient documentation

## 2017-09-25 DIAGNOSIS — Z331 Pregnant state, incidental: Secondary | ICD-10-CM

## 2017-09-25 DIAGNOSIS — Z3490 Encounter for supervision of normal pregnancy, unspecified, unspecified trimester: Secondary | ICD-10-CM | POA: Insufficient documentation

## 2017-09-25 DIAGNOSIS — Z1389 Encounter for screening for other disorder: Secondary | ICD-10-CM

## 2017-09-25 LAB — POCT URINALYSIS DIPSTICK
Blood, UA: NEGATIVE
Glucose, UA: NEGATIVE
Ketones, UA: NEGATIVE
LEUKOCYTES UA: NEGATIVE
NITRITE UA: NEGATIVE
PROTEIN UA: NEGATIVE

## 2017-09-25 NOTE — Progress Notes (Addendum)
INITIAL OBSTETRICAL VISIT Patient name: Shelia Brown MRN [id number]  Date of birth: 05-Sep-1991 Chief Complaint:   Initial Prenatal Visit (cramping at night)  History of Present Illness:   Shelia Brown is a 26 y.o. 562P1001 African American female at 932w4d by LMP c/w 7wk u/s, with an Estimated Date of Delivery: 04/19/18 being seen today for her initial obstetrical visit.  Is in [employer]- needs note w/ [employer]. THC use earlier- quit w/ PT.  Her obstetrical history is significant for term uncomplicated SVB x 1.   Today she reports some occ mild cramping at night. Denies lof, vb, uti s/s, abnormal d/c itching/odor/irritation.  Patient's last menstrual period was 07/13/2017. Last pap 01/31/16. Results were: normal Review of Systems:   Pertinent items are noted in HPI Denies cramping/contractions, leakage of fluid, vaginal bleeding, abnormal vaginal discharge w/ itching/odor/irritation, headaches, visual changes, shortness of breath, chest pain, abdominal pain, severe nausea/vomiting, or problems with urination or bowel movements unless otherwise stated above.  Pertinent History Reviewed:  Reviewed past medical,surgical, social, obstetrical and family history.  Reviewed problem list, medications and allergies. OB History  Gravida Para Term Preterm AB Living  2 1 1     1   SAB TAB Ectopic Multiple Live Births          1    # Outcome Date GA Lbr Len/2nd Weight Sex Delivery Anes PTL Lv  2 Current           1 Term 11/23/11 4244w4d 08:44 / 00:35 6 lb 13 oz (3.09 kg) F Vag-Spont EPI N LIV     Birth Comments: Normal     Physical Assessment:   Vitals:   09/25/17 1000  BP: 120/62  Pulse: 74  Weight: 114 lb (51.7 kg)  Body mass index is 18.4 kg/m.       Physical Examination:  General appearance - well appearing, and in no distress  Mental status - alert, oriented to person, place, and time  Psych:  She has a normal mood and affect  Skin - warm and dry, normal color, no suspicious lesions  noted  Chest - effort normal, all lung fields clear to auscultation bilaterally  Heart - normal rate and regular rhythm  Abdomen - soft, nontender  Extremities:  No swelling or varicosities noted  Thin prep pap is not done   Fetal Heart Rate (bpm): +u/s via informal transabdominal u/s, +active fetus  Results for orders placed or performed in visit on 09/25/17 (from the past 24 hour(s))  POCT urinalysis dipstick   Collection Time: 09/25/17 10:17 AM  Result Value Ref Range   Color, UA     Clarity, UA     Glucose, UA neg    Bilirubin, UA     Ketones, UA neg    Spec Grav, UA  1.010 - 1.025   Blood, UA neg    pH, UA  5.0 - 8.0   Protein, UA neg    Urobilinogen, UA  0.2 or 1.0 E.U./dL   Nitrite, UA neg    Leukocytes, UA Negative Negative    Assessment & Plan:  1) Low-Risk Pregnancy G2P1001 at 3232w4d with an Estimated Date of Delivery: 04/19/18   2) Initial OB visit  3) Occ mild cramping> reviewed warning s/s to report  4) THC use earlier- quit w/ +PT, advised continued cessation  Initial labs obtained Continue prenatal vitamins Reviewed n/v relief measures and warning s/s to report Reviewed recommended weight gain based on pre-gravid BMI Encouraged  well-balanced diet Genetic Screening discussed Integrated Screen: declined Cystic fibrosis screening discussed declined Ultrasound discussed; fetal survey: requested CCNC completed>has applied for preg [place], PCM not here Declined flu shot. She was advised that the flu shot is recommended during pregnancy to help protect her and her baby. We discussed that it is an inactivated vaccine-so side effects are minimal, it is considered safe to receive during any trimester, and pregnant women are at a higher risk of developing potential complications from the flu, including death. She was given printed information from the CDC regarding the flu shot and the flu.    Follow-up: Return in about 4 weeks (around 10/23/2017) for LROB.   Orders  Placed This Encounter  Procedures  . GC/Chlamydia Probe Amp  . Urine Culture  . CBC  . Hepatitis B surface antigen  . HIV antibody  . Varicella zoster antibody, IgG  . Urinalysis, Routine w reflex microscopic  . RPR  . Rubella screen  . Sickle cell screen  . Pain Management Screening Profile (10S)  . POCT urinalysis dipstick  . ABO/Rh  . Antibody screen    Marge DuncansBooker, Kimberly Randall CNM, WHNP-BC 09/25/2017 11:00 AM

## 2017-09-25 NOTE — Patient Instructions (Addendum)
Georgiann CockerBeate A Storti, I greatly value your feedback.  If you receive a survey following your visit with us today, we appreciate you taking the time to fill it out.  Thanks, Joellyn HaffKim Ashlan Dignan, CNM, WHNP-BC   Nausea & Vomiting  Have saltine crackers or pretzels by your bed and eat a few bites before you raise your head out of bed in the morning  Eat small frequent meals throughout the day instead of large meals  Drink plenty of fluids throughout the day to stay hydrated, just don't drink a lot of fluids with your meals.  This can make your stomach fill up faster making you feel sick  Do not brush your teeth right after you eat  Products with real ginger are good for nausea, like ginger ale and ginger hard candy Make sure it says made with real ginger!  Sucking on sour candy like lemon heads is also good for nausea  If your prenatal vitamins make you nauseated, take them at night so you will sleep through the nausea  Sea Bands  If you feel like you need medicine for the nausea & vomiting please let us know  If you are unable to keep any fluids or food down please let us know   Constipation  Drink plenty of fluid, preferably water, throughout the day  Eat foods high in fiber such as fruits, vegetables, and grains  Exercise, such as walking, is a good way to keep your bowels regular  Drink warm fluids, especially warm prune juice, or decaf coffee  Eat a 1/2 cup of real oatmeal (not instant), 1/2 cup applesauce, and 1/2-1 cup warm prune juice every day  If needed, you may take Colace (docusate sodium) stool softener once or twice a day to help keep the stool soft. If you are pregnant, wait until you are out of your first trimester (12-14 weeks of pregnancy)  If you still are having problems with constipation, you may take Miralax once daily as needed to help keep your bowels regular.  If you are pregnant, wait until you are out of your first trimester (12-14 weeks of pregnancy)   First  Trimester of Pregnancy The first trimester of pregnancy is from week 1 until the end of week 12 (months 1 through 3). A week after a sperm fertilizes an egg, the egg will implant on the wall of the uterus. This embryo will begin to develop into a baby. Genes from you and your partner are forming the baby. The female genes determine whether the baby is a boy or a girl. At 6-8 weeks, the eyes and face are formed, and the heartbeat can be seen on ultrasound. At the end of 12 weeks, all the baby's organs are formed.  Now that you are pregnant, you will want to do everything you can to have a healthy baby. Two of the most important things are to get good prenatal care and to follow your health care provider's instructions. Prenatal care is all the medical care you receive before the baby's birth. This care will help prevent, find, and treat any problems during the pregnancy and childbirth. BODY CHANGES Your body goes through many changes during pregnancy. The changes vary from woman to woman.   You may gain or lose a couple of pounds at first.  You may feel sick to your stomach (nauseous) and throw up (vomit). If the vomiting is uncontrollable, call your health care provider.  You may tire easily.  You may develop headaches  that can be relieved by medicines approved by your health care provider.  You may urinate more often. Painful urination may mean you have a bladder infection.  You may develop heartburn as a result of your pregnancy.  You may develop constipation because certain hormones are causing the muscles that push waste through your intestines to slow down.  You may develop hemorrhoids or swollen, bulging veins (varicose veins).  Your breasts may begin to grow larger and become tender. Your nipples may stick out more, and the tissue that surrounds them (areola) may become darker.  Your gums may bleed and may be sensitive to brushing and flossing.  Dark spots or blotches (chloasma, mask  of pregnancy) may develop on your face. This will likely fade after the baby is born.  Your menstrual periods will stop.  You may have a loss of appetite.  You may develop cravings for certain kinds of food.  You may have changes in your emotions from day to day, such as being excited to be pregnant or being concerned that something may go wrong with the pregnancy and baby.  You may have more vivid and strange dreams.  You may have changes in your hair. These can include thickening of your hair, rapid growth, and changes in texture. Some women also have hair loss during or after pregnancy, or hair that feels dry or thin. Your hair will most likely return to normal after your baby is born. WHAT TO EXPECT AT YOUR PRENATAL VISITS During a routine prenatal visit:  You will be weighed to make sure you and the baby are growing normally.  Your blood pressure will be taken.  Your abdomen will be measured to track your baby's growth.  The fetal heartbeat will be listened to starting around week 10 or 12 of your pregnancy.  Test results from any previous visits will be discussed. Your health care provider may ask you:  How you are feeling.  If you are feeling the baby move.  If you have had any abnormal symptoms, such as leaking fluid, bleeding, severe headaches, or abdominal cramping.  If you have any questions. Other tests that may be performed during your first trimester include:  Blood tests to find your blood type and to check for the presence of any previous infections. They will also be used to check for low iron levels (anemia) and Rh antibodies. Later in the pregnancy, blood tests for diabetes will be done along with other tests if problems develop.  Urine tests to check for infections, diabetes, or protein in the urine.  An ultrasound to confirm the proper growth and development of the baby.  An amniocentesis to check for possible genetic problems.  Fetal screens for spina  bifida and Down syndrome.  You may need other tests to make sure you and the baby are doing well. HOME CARE INSTRUCTIONS  Medicines  Follow your health care provider's instructions regarding medicine use. Specific medicines may be either safe or unsafe to take during pregnancy.  Take your prenatal vitamins as directed.  If you develop constipation, try taking a stool softener if your health care provider approves. Diet  Eat regular, well-balanced meals. Choose a variety of foods, such as meat or vegetable-based protein, fish, milk and low-fat dairy products, vegetables, fruits, and whole grain breads and cereals. Your health care provider will help you determine the amount of weight gain that is right for you.  Avoid raw meat and uncooked cheese. These carry germs that can  cause birth defects in the baby.  Eating four or five small meals rather than three large meals a day may help relieve nausea and vomiting. If you start to feel nauseous, eating a few soda crackers can be helpful. Drinking liquids between meals instead of during meals also seems to help nausea and vomiting.  If you develop constipation, eat more high-fiber foods, such as fresh vegetables or fruit and whole grains. Drink enough fluids to keep your urine clear or pale yellow. Activity and Exercise  Exercise only as directed by your health care provider. Exercising will help you:  Control your weight.  Stay in shape.  Be prepared for labor and delivery.  Experiencing pain or cramping in the lower abdomen or low back is a good sign that you should stop exercising. Check with your health care provider before continuing normal exercises.  Try to avoid standing for long periods of time. Move your legs often if you must stand in one place for a long time.  Avoid heavy lifting.  Wear low-heeled shoes, and practice good posture.  You may continue to have sex unless your health care provider directs you  otherwise. Relief of Pain or Discomfort  Wear a good support bra for breast tenderness.   Take warm sitz baths to soothe any pain or discomfort caused by hemorrhoids. Use hemorrhoid cream if your health care provider approves.   Rest with your legs elevated if you have leg cramps or low back pain.  If you develop varicose veins in your legs, wear support hose. Elevate your feet for 15 minutes, 3-4 times a day. Limit salt in your diet. Prenatal Care  Schedule your prenatal visits by the twelfth week of pregnancy. They are usually scheduled monthly at first, then more often in the last 2 months before delivery.  Write down your questions. Take them to your prenatal visits.  Keep all your prenatal visits as directed by your health care provider. Safety  Wear your seat belt at all times when driving.  Make a list of emergency phone numbers, including numbers for family, friends, the hospital, and police and fire departments. General Tips  Ask your health care provider for a referral to a local prenatal education class. Begin classes no later than at the beginning of month 6 of your pregnancy.  Ask for help if you have counseling or nutritional needs during pregnancy. Your health care provider can offer advice or refer you to specialists for help with various needs.  Do not use hot tubs, steam rooms, or saunas.  Do not douche or use tampons or scented sanitary pads.  Do not cross your legs for long periods of time.  Avoid cat litter boxes and soil used by cats. These carry germs that can cause birth defects in the baby and possibly loss of the fetus by miscarriage or stillbirth.  Avoid all smoking, herbs, alcohol, and medicines not prescribed by your health care provider. Chemicals in these affect the formation and growth of the baby.  Schedule a dentist appointment. At home, brush your teeth with a soft toothbrush and be gentle when you floss. SEEK MEDICAL CARE IF:   You have  dizziness.  You have mild pelvic cramps, pelvic pressure, or nagging pain in the abdominal area.  You have persistent nausea, vomiting, or diarrhea.  You have a bad smelling vaginal discharge.  You have pain with urination.  You notice increased swelling in your face, hands, legs, or ankles. SEEK IMMEDIATE MEDICAL CARE IF:  You have a fever.  You are leaking fluid from your vagina.  You have spotting or bleeding from your vagina.  You have severe abdominal cramping or pain.  You have rapid weight gain or loss.  You vomit blood or material that looks like coffee grounds.  You are exposed to [place] measles and have never had them.  You are exposed to fifth disease or chickenpox.  You develop a severe headache.  You have shortness of breath.  You have any kind of trauma, such as from a fall or a car accident. Document Released: 10/23/2001 Document Revised: 03/15/2014 Document Reviewed: 09/08/2013 [facility] Patient Information 2015 [place], [place]. This information is not intended to replace advice given to you by your health care provider. Make sure you discuss any questions you have with your health care provider.  PROTECT YOURSELF & YOUR BABY FROM THE FLU! Because you are pregnant, we at [facility], along with the Centers for Disease Control (CDC), recommend that you receive the flu vaccine to protect yourself and your baby from the flu. The flu is more likely to cause severe illness in pregnant women than in women of reproductive age who are not pregnant. Changes in the immune system, heart, and lungs during pregnancy make pregnant women (and women up to two weeks postpartum) more prone to severe illness from flu, including illness resulting in hospitalization. Flu also may be harmful for a pregnant woman's developing baby. A common flu symptom is fever, which may be associated with neural tube defects and other adverse outcomes for a developing baby. Getting vaccinated can  also help protect a baby after birth from flu. (Mom passes antibodies onto the developing baby during her pregnancy.)  A Flu Vaccine is the Best Protection Against Flu Getting a flu vaccine is the first and most important step in protecting against flu. Pregnant women should get a flu shot and not the live attenuated influenza vaccine (LAIV), also known as nasal spray flu vaccine. Flu vaccines given during pregnancy help protect both the mother and her baby from flu. Vaccination has been shown to reduce the risk of flu-associated acute respiratory infection in pregnant women by up to one-half. A 2018 study showed that getting a flu shot reduced a pregnant woman's risk of being hospitalized with flu by an average of 40 percent. Pregnant women who get a flu vaccine are also helping to protect their babies from flu illness for the first several months after their birth, when they are too young to get vaccinated.   A Long Record of Safety for Flu Shots in Pregnant Women Flu shots have been given to millions of pregnant women over many years with a good safety record. There is a lot of evidence that flu vaccines can be given safely during pregnancy; though these data are limited for the first trimester. The CDC recommends that pregnant women get vaccinated during any trimester of their pregnancy. It is very important for pregnant women to get the flu shot.   Other Preventive Actions In addition to getting a flu shot, pregnant women should take the same everyday preventive actions the CDC recommends of everyone, including covering coughs, washing hands often, and avoiding people who are sick.  Symptoms and Treatment If you get sick with flu symptoms call your doctor right away. There are antiviral drugs that can treat flu illness and prevent serious flu complications. The CDC recommends prompt treatment for people who have influenza infection or suspected influenza infection and who are at  high risk of serious  flu complications, such as people with asthma, diabetes (including gestational diabetes), or heart disease. Early treatment of influenza in hospitalized pregnant women has been shown to reduce the length of the hospital stay.  Symptoms Flu symptoms include fever, cough, sore throat, runny or stuffy nose, body aches, headache, chills and fatigue. Some people may also have vomiting and diarrhea. People may be infected with the flu and have respiratory symptoms without a fever.  Early Treatment is Important for Pregnant Women Treatment should begin as soon as possible because antiviral drugs work best when started early (within 48 hours after symptoms start). Antiviral drugs can make your flu illness milder and make you feel better faster. They may also prevent serious health problems that can result from flu illness. Oral oseltamivir (Tamiflu) is the preferred treatment for pregnant women because it has the most studies available to suggest that it is safe and beneficial. Antiviral drugs require a prescription from your provider. Having a fever caused by flu infection or other infections early in pregnancy may be linked to birth defects in a baby. In addition to taking antiviral drugs, pregnant women who get a fever should treat their fever with Tylenol (acetaminophen) and contact their provider immediately.  When to [place] If you are pregnant and have any of these signs, seek care immediately:  Difficulty breathing or shortness of breath  Pain or pressure in the chest or abdomen  Sudden dizziness  Confusion  Severe or persistent vomiting  High fever that is not responding to Tylenol (or store brand equivalent)  Decreased or no movement of your baby  [email or web address]

## 2017-09-26 LAB — VARICELLA ZOSTER ANTIBODY, IGG: VARICELLA: 180 {index} (ref >165)

## 2017-09-26 LAB — CBC
HEMATOCRIT: 38.5 % (ref 34.0–46.6)
HEMOGLOBIN: 13.4 g/dL (ref 11.1–15.9)
MCH: 30.6 pg (ref 26.6–33.0)
MCHC: 34.8 g/dL (ref 31.5–35.7)
MCV: 88 fL (ref 79–97)
Platelets: 275 10*3/uL (ref 150–379)
RBC: 4.38 x10E6/uL (ref 3.77–5.28)
RDW: 13.6 % (ref 12.3–15.4)
WBC: 10.3 10*3/uL (ref 3.4–10.8)

## 2017-09-26 LAB — URINALYSIS, ROUTINE W REFLEX MICROSCOPIC
Bilirubin, UA: NEGATIVE
GLUCOSE, UA: NEGATIVE
Ketones, UA: NEGATIVE
LEUKOCYTES UA: NEGATIVE
Nitrite, UA: NEGATIVE
PH UA: 8.5 — AB (ref 5.0–7.5)
PROTEIN UA: NEGATIVE
RBC, UA: NEGATIVE
Specific Gravity, UA: 1.023 (ref 1.005–1.030)
Urobilinogen, Ur: 1 mg/dL (ref 0.2–1.0)

## 2017-09-26 LAB — PMP SCREEN PROFILE (10S), URINE
AMPHETAMINE SCREEN URINE: NEGATIVE ng/mL
BARBITURATE SCREEN URINE: NEGATIVE ng/mL
BENZODIAZEPINE SCREEN, URINE: NEGATIVE ng/mL
CANNABINOIDS UR QL SCN: NEGATIVE ng/mL
CREATININE(CRT), U: 106.6 mg/dL (ref 20.0–300.0)
Cocaine (Metab) Scrn, Ur: NEGATIVE ng/mL
METHADONE SCREEN, URINE: NEGATIVE ng/mL
OXYCODONE+OXYMORPHONE UR QL SCN: NEGATIVE ng/mL
Opiate Scrn, Ur: NEGATIVE ng/mL
PH UR, DRUG SCRN: 8.5 (ref 4.5–8.9)
PHENCYCLIDINE QUANTITATIVE URINE: NEGATIVE ng/mL
PROPOXYPHENE SCREEN URINE: NEGATIVE ng/mL

## 2017-09-26 LAB — RPR: RPR Ser Ql: NONREACTIVE

## 2017-09-26 LAB — GC/CHLAMYDIA PROBE AMP
CHLAMYDIA, DNA PROBE: NEGATIVE
NEISSERIA GONORRHOEAE BY PCR: NEGATIVE

## 2017-09-26 LAB — ABO/RH: RH TYPE: POSITIVE

## 2017-09-26 LAB — SICKLE CELL SCREEN: Sickle Cell Screen: NEGATIVE

## 2017-09-26 LAB — ANTIBODY SCREEN: ANTIBODY SCREEN: NEGATIVE

## 2017-09-26 LAB — RUBELLA SCREEN: Rubella Antibodies, IGG: 1.56 index (ref >0.99)

## 2017-09-26 LAB — HEPATITIS B SURFACE ANTIGEN: HEP B S AG: NEGATIVE

## 2017-09-26 LAB — HIV ANTIBODY (ROUTINE TESTING W REFLEX): HIV Screen 4th Generation wRfx: NONREACTIVE

## 2017-09-27 LAB — URINE CULTURE

## 2017-10-02 ENCOUNTER — Encounter: Payer: Self-pay | Admitting: Women's Health

## 2017-10-23 ENCOUNTER — Ambulatory Visit (INDEPENDENT_AMBULATORY_CARE_PROVIDER_SITE_OTHER): Payer: BLUE CROSS/BLUE SHIELD | Admitting: Obstetrics and Gynecology

## 2017-10-23 VITALS — BP 122/66 | HR 80 | Wt 119.0 lb

## 2017-10-23 DIAGNOSIS — Z3492 Encounter for supervision of normal pregnancy, unspecified, second trimester: Secondary | ICD-10-CM

## 2017-10-23 DIAGNOSIS — Z1389 Encounter for screening for other disorder: Secondary | ICD-10-CM

## 2017-10-23 DIAGNOSIS — Z3A14 14 weeks gestation of pregnancy: Secondary | ICD-10-CM

## 2017-10-23 DIAGNOSIS — Z331 Pregnant state, incidental: Secondary | ICD-10-CM

## 2017-10-23 LAB — POCT URINALYSIS DIPSTICK
Blood, UA: NEGATIVE
GLUCOSE UA: NEGATIVE
Ketones, UA: NEGATIVE
LEUKOCYTES UA: NEGATIVE
NITRITE UA: NEGATIVE
PROTEIN UA: NEGATIVE

## 2017-10-23 NOTE — Progress Notes (Signed)
Shelia Brown is a 26 y.o. female 702P1001  Estimated Date of Delivery: 04/19/18 LROB 2333w4d  Chief Complaint  Patient presents with  . Routine Prenatal Visit  ____  Patient has no complaints .Pt has had Nexplanon before, and states she spotted often while using it. She is [employer]. She prefers to take OCPs for birth control.  Patient reports good fetal movement,                          denies any bleeding , rupture of membranes,or regular contractions.  Blood pressure 122/66, pulse 80, weight 119 lb (54 kg), last menstrual period 07/13/2017.   Urine results:notable for none refer to the ob flow sheet for FH and FHR, ,                          Physical Examination: General appearance - alert, well appearing, and in no distress                                      Abdomen - FH not done                                                        -FHR                                                          soft, nontender, nondistended, no masses or organomegaly                                      Pelvic - Not indicated                                            Questions were answered. Assessment: LROB G2P1001 @ 3733w4d Estimated Date of Delivery: 04/19/18 Contr man: pills Bottle feeding Plan:  Continued routine obstetrical care, LROB  F/u in 4 weeks for LROB anatomy scan    By signing my name below, I, Izna Ahmed, attest that this documentation has been prepared under the direction and in the presence of Tilda BurrowFerguson, Rossetta Kama V, MD. Electronically Signed: Redge GainerIzna Ahmed, Medical Scribe. 10/23/17. 9:08 AM.  I personally performed the services described in this documentation, which was SCRIBED in my presence. The recorded information has been reviewed and considered accurate. It has been edited as necessary during review. Tilda BurrowJohn V Amilya Haver, MD

## 2017-11-12 NOTE — L&D Delivery Note (Signed)
Patient: Shelia Brown MRN: [id number]  GBS status: negative  Patient is a 27 y.o. now [id number] s/p NSVD at 5484w3d, who was admitted from the office in active labor at 9cm. AROM 0h 3756m prior to delivery with clear fluid.   Delivery Note At 11:14 AM a viable female was delivered via Vaginal, Spontaneous (Presentation: vertex; LOA ).  APGAR: 9, 9; weight pending  .   Placenta status: intact, sent to L&D  Cord:  3-vessel. with the following complications: none  Anesthesia:  None Episiotomy: None Lacerations: 1st degree Suture Repair: none Est. Blood Loss (mL): 100  Dr. Lowanda FosterBrittany Hipkins present for delivery .   Raynelle FanningJulie P. Degele, MD OB Fellow 04/15/18, 11:32 AM

## 2017-11-14 ENCOUNTER — Telehealth: Payer: Self-pay | Admitting: *Deleted

## 2017-11-14 NOTE — Telephone Encounter (Signed)
Patient called with complaints of vaginal itching and discharge.  She had a yeast infection back in August and used Monistat which helped but now has another one.  Advised patient to use the Monistat again since it cleared it up last time and if it did not help, let us know.  At her next appointment she can have a swab to make sure it is not something else.  Also encouraged eating yogurt.  No further questions.

## 2017-11-19 ENCOUNTER — Other Ambulatory Visit: Payer: Self-pay | Admitting: Obstetrics and Gynecology

## 2017-11-19 DIAGNOSIS — Z363 Encounter for antenatal screening for malformations: Secondary | ICD-10-CM

## 2017-11-20 ENCOUNTER — Ambulatory Visit (INDEPENDENT_AMBULATORY_CARE_PROVIDER_SITE_OTHER): Payer: BLUE CROSS/BLUE SHIELD

## 2017-11-20 ENCOUNTER — Ambulatory Visit (INDEPENDENT_AMBULATORY_CARE_PROVIDER_SITE_OTHER): Payer: BLUE CROSS/BLUE SHIELD | Admitting: Obstetrics and Gynecology

## 2017-11-20 VITALS — BP 118/60 | HR 84 | Wt 120.8 lb

## 2017-11-20 DIAGNOSIS — Z3A18 18 weeks gestation of pregnancy: Secondary | ICD-10-CM

## 2017-11-20 DIAGNOSIS — Z3402 Encounter for supervision of normal first pregnancy, second trimester: Secondary | ICD-10-CM

## 2017-11-20 DIAGNOSIS — Z1389 Encounter for screening for other disorder: Secondary | ICD-10-CM

## 2017-11-20 DIAGNOSIS — Z3492 Encounter for supervision of normal pregnancy, unspecified, second trimester: Secondary | ICD-10-CM

## 2017-11-20 DIAGNOSIS — Z363 Encounter for antenatal screening for malformations: Secondary | ICD-10-CM

## 2017-11-20 DIAGNOSIS — Z331 Pregnant state, incidental: Secondary | ICD-10-CM

## 2017-11-20 NOTE — Progress Notes (Addendum)
Patient ID: Shelia Brown, female   DOB: [birth year], 27 y.o.   MRN: [id number]   LOW-RISK PREGNANCY VISIT Patient name: Shelia Brown MRN [id number]  Date of birth: [birth year] Chief Complaint:   Routine Prenatal Visit (Anatomy Scan today)  History of Present Illness:   Shelia Brown is a 27 y.o. 612P1001 female at 7165w4d with an Estimated Date of Delivery: 04/19/18 being seen today for ongoing management of a low-risk pregnancy.  Today she reports no complaints. The patient is not planning on breastfeeding. She adds she will be bottle feeding her baby. She states that the baby's father is working and they would like to find out the gender together. After giving birth she would like to take birth control pills . Vag. Bleeding: None.  Movement: Present. denies leaking of fluid. Review of Systems:   Pertinent items are noted in HPI Denies abnormal vaginal discharge w/ itching/odor/irritation, headaches, visual changes, shortness of breath, chest pain, abdominal pain, severe nausea/vomiting, or problems with urination or bowel movements unless otherwise stated above. Pertinent History Reviewed:  Reviewed past medical,surgical, social, obstetrical and family history.  Reviewed problem list, medications and allergies. Physical Assessment:   Vitals:   11/20/17 0919  BP: 118/60  Pulse: 84  Weight: 120 lb 12.8 oz (54.8 kg)  Body mass index is 19.5 kg/m.        Physical Examination:   General appearance: Well appearing, and in no distress  Mental status: Alert, oriented to person, place, and time  Skin: Warm & dry  Cardiovascular: Normal heart rate noted  Respiratory: Normal respiratory effort, no distress  Abdomen: Soft, gravid, nontender  Pelvic: Cervical exam deferred         Extremities: Edema: None  Fetal Status: Fetal Heart Rate (bpm): 147 u/s   Movement: Present    No results found for this or any previous visit (from the past 24 hour(s)).  Assessment & Plan:  1) Low-risk pregnancy  G2P1001 at 8865w4d with an Estimated Date of Delivery: 04/19/18    Meds: No orders of the defined types were placed in this encounter.  L  Plan:  Continue routine obstetrical care   Reviewed:   Follow-up: No Follow-up on file.  Orders Placed This Encounter  Procedures  . POCT Urinalysis Dipstick   By signing my name below, I, Diona BrownerJennifer Gorman, attest that this documentation has been prepared under the direction and in the presence of Tilda BurrowFerguson, Peggie Hornak V, MD. Electronically Signed: Diona BrownerJennifer Gorman, Medical Scribe. 11/20/17. 9:49 AM.  I personally performed the services described in this documentation, which was SCRIBED in my presence. The recorded information has been reviewed and considered accurate. It has been edited as necessary during review. Tilda BurrowJohn V Zalea Pete, MD

## 2017-11-20 NOTE — Progress Notes (Signed)
US 18+4 wks,trans head left,normal ovaries bilat,posterior pl gr 0,svp of fluid 6 cm,fhr 147 bpm,cx 3.5 cm,efw 287 g,anatomy complete,no obvious abnormalities

## 2017-11-29 NOTE — Addendum Note (Signed)
Addended by: Moss McRESENZO, Quavon Keisling M on: 11/29/2017 10:30 AM   Modules accepted: Orders

## 2017-12-18 ENCOUNTER — Encounter: Payer: Self-pay | Admitting: Advanced Practice Midwife

## 2017-12-18 ENCOUNTER — Ambulatory Visit (INDEPENDENT_AMBULATORY_CARE_PROVIDER_SITE_OTHER): Payer: BLUE CROSS/BLUE SHIELD | Admitting: Advanced Practice Midwife

## 2017-12-18 ENCOUNTER — Other Ambulatory Visit: Payer: Self-pay

## 2017-12-18 VITALS — BP 118/76 | HR 73 | Wt 126.0 lb

## 2017-12-18 DIAGNOSIS — Z331 Pregnant state, incidental: Secondary | ICD-10-CM

## 2017-12-18 DIAGNOSIS — Z1389 Encounter for screening for other disorder: Secondary | ICD-10-CM

## 2017-12-18 DIAGNOSIS — Z3A22 22 weeks gestation of pregnancy: Secondary | ICD-10-CM

## 2017-12-18 DIAGNOSIS — Z3482 Encounter for supervision of other normal pregnancy, second trimester: Secondary | ICD-10-CM

## 2017-12-18 LAB — POCT URINALYSIS DIPSTICK
GLUCOSE UA: NEGATIVE
Ketones, UA: NEGATIVE
LEUKOCYTES UA: NEGATIVE
NITRITE UA: NEGATIVE
PROTEIN UA: NEGATIVE
RBC UA: NEGATIVE

## 2017-12-18 NOTE — Progress Notes (Signed)
G2P1001 6440w4d Estimated Date of Delivery: 04/19/18  Blood pressure 118/76, pulse 73, weight 126 lb (57.2 kg), last menstrual period 07/13/2017.   BP weight and urine results all reviewed and noted.  Please refer to the obstetrical flow sheet for the fundal height and fetal heart rate documentation:  Patient reports good fetal movement, denies any bleeding and no rupture of membranes symptoms or regular contractions. Patient is without complaints. All questions were answered.  Orders Placed This Encounter  Procedures  . POCT urinalysis dipstick    Plan:  Continued routine obstetrical care,   Return in about 4 weeks (around 01/15/2018) for PN2/LROB.

## 2017-12-18 NOTE — Patient Instructions (Addendum)

## 2018-01-15 ENCOUNTER — Encounter: Payer: BLUE CROSS/BLUE SHIELD | Admitting: Women's Health

## 2018-01-15 ENCOUNTER — Other Ambulatory Visit: Payer: BLUE CROSS/BLUE SHIELD

## 2018-01-20 ENCOUNTER — Encounter: Payer: Self-pay | Admitting: Women's Health

## 2018-01-20 ENCOUNTER — Ambulatory Visit (INDEPENDENT_AMBULATORY_CARE_PROVIDER_SITE_OTHER): Payer: BLUE CROSS/BLUE SHIELD | Admitting: Women's Health

## 2018-01-20 ENCOUNTER — Other Ambulatory Visit: Payer: BLUE CROSS/BLUE SHIELD

## 2018-01-20 VITALS — BP 120/60 | HR 81 | Wt 132.5 lb

## 2018-01-20 DIAGNOSIS — Z3A27 27 weeks gestation of pregnancy: Secondary | ICD-10-CM

## 2018-01-20 DIAGNOSIS — Z3482 Encounter for supervision of other normal pregnancy, second trimester: Secondary | ICD-10-CM

## 2018-01-20 DIAGNOSIS — Z131 Encounter for screening for diabetes mellitus: Secondary | ICD-10-CM

## 2018-01-20 DIAGNOSIS — R102 Pelvic and perineal pain: Secondary | ICD-10-CM

## 2018-01-20 DIAGNOSIS — Z1389 Encounter for screening for other disorder: Secondary | ICD-10-CM

## 2018-01-20 DIAGNOSIS — O9989 Other specified diseases and conditions complicating pregnancy, childbirth and the puerperium: Secondary | ICD-10-CM

## 2018-01-20 DIAGNOSIS — Z331 Pregnant state, incidental: Secondary | ICD-10-CM

## 2018-01-20 LAB — POCT URINALYSIS DIPSTICK
Glucose, UA: NEGATIVE
KETONES UA: NEGATIVE
LEUKOCYTES UA: NEGATIVE
NITRITE UA: NEGATIVE
PROTEIN UA: NEGATIVE

## 2018-01-20 NOTE — Progress Notes (Signed)
   LOW-RISK PREGNANCY VISIT Patient name: Shelia Brown Ells MRN [id number]  Date of birth: 1990/12/13 Chief Complaint:   Routine Prenatal Visit (PN2 today; low stomach pain at night x 1 week)  History of Present Illness:   Shelia Brown [place] is Brown 27 y.o. 682P1001 female at 4463w2d with an Estimated Date of Delivery: 04/19/18 being seen today for ongoing management of Brown low-risk pregnancy.  Today she reports cramps at night when getting in bed, go away on their own. Denies uti s/s, abnormal discharge, itching/odor/irritation.   Contractions: Not present. Vag. Bleeding: None.  Movement: Present. denies leaking of fluid. Review of Systems:   Pertinent items are noted in HPI Denies abnormal vaginal discharge w/ itching/odor/irritation, headaches, visual changes, shortness of breath, chest pain, abdominal pain, severe nausea/vomiting, or problems with urination or bowel movements unless otherwise stated above. Pertinent History Reviewed:  Reviewed past medical,surgical, social, obstetrical and family history.  Reviewed problem list, medications and allergies. Physical Assessment:   Vitals:   01/20/18 0930  BP: 120/60  Pulse: 81  Weight: 132 lb 8 oz (60.1 kg)  Body mass index is 21.39 kg/m.        Physical Examination:   General appearance: Well appearing, and in no distress  Mental status: Alert, oriented to person, place, and time  Skin: Warm & dry  Cardiovascular: Normal heart rate noted  Respiratory: Normal respiratory effort, no distress  Abdomen: Soft, gravid, nontender  Pelvic: Cervical exam deferred         Extremities: Edema: None  Fetal Status: Fetal Heart Rate (bpm): 148 Fundal Height: 25 cm Movement: Present    Results for orders placed or performed in visit on 01/20/18 (from the past 24 hour(s))  POCT urinalysis dipstick   Collection Time: 01/20/18  9:31 AM  Result Value Ref Range   Color, UA     Clarity, UA     Glucose, UA neg    Bilirubin, UA     Ketones, UA neg    Spec  Grav, UA  1.010 - 1.025   Blood, UA trace    pH, UA  5.0 - 8.0   Protein, UA neg    Urobilinogen, UA  0.2 or 1.0 E.U./dL   Nitrite, UA neg    Leukocytes, UA Negative Negative   Appearance     Odor      Assessment & Plan:  1) Low-risk pregnancy G2P1001 at 1263w2d with an Estimated Date of Delivery: 04/19/18   2) Cramps at night, increase water throughout the day, discussed ptl s/s, reasons to seek care   Meds: No orders of the defined types were placed in this encounter.  Labs/procedures today: pn2  Plan:  Continue routine obstetrical care   Reviewed: Preterm labor symptoms and general obstetric precautions including but not limited to vaginal bleeding, contractions, leaking of fluid and fetal movement were reviewed in detail with the patient.  Recommended Tdap at HD/PCP per CDC guidelines. All questions were answered  Follow-up: Return in about 3 weeks (around 02/10/2018) for LROB.  Orders Placed This Encounter  Procedures  . POCT urinalysis dipstick   Cheral MarkerKimberly R Elisa Kutner CNM, [facility]NP[facility] 01/20/2018 10:08 AM

## 2018-01-20 NOTE — Patient Instructions (Signed)
Shelia Brown, I greatly value your feedback.  If you receive a survey following your visit with us today, we appreciate you taking the time to fill it out.  Thanks, Shelia Brown, CNM, WHNP-BC   Call the office [phone]) or go to [facility] if:  You begin to have strong, frequent contractions  Your water breaks.  Sometimes it is a big gush of fluid, sometimes it is just a trickle that keeps getting your panties wet or running down your legs  You have vaginal bleeding.  It is normal to have a small amount of spotting if your cervix was checked.   You don't feel your baby moving like normal.  If you don't, get you something to eat and drink and lay down and focus on feeling your baby move.  You should feel at least 10 movements in 2 hours.  If you don't, you should call the office or go to [facility].    Tdap Vaccine  It is recommended that you get the Tdap vaccine during the third trimester of EACH pregnancy to help protect your baby from getting pertussis (whooping cough)  27-36 weeks is the BEST time to do this so that you can pass the protection on to your baby. During pregnancy is better than after pregnancy, but if you are unable to get it during pregnancy it will be offered at the hospital.   You can get this vaccine at the health department or your family doctor  Everyone who will be around your baby should also be up-to-date on their vaccines. Adults (who are not pregnant) only need 1 dose of Tdap during adulthood.   Third Trimester of Pregnancy The third trimester is from week 29 through week 42, months 7 through 9. The third trimester is a time when the fetus is growing rapidly. At the end of the ninth month, the fetus is about 20 inches in length and weighs 6-10 pounds.  BODY CHANGES Your body goes through many changes during pregnancy. The changes vary from woman to woman.   Your weight will continue to increase. You can expect to gain 25-35 pounds (11-16 kg) by  the end of the pregnancy.  You may begin to get stretch marks on your hips, abdomen, and breasts.  You may urinate more often because the fetus is moving lower into your pelvis and pressing on your bladder.  You may develop or continue to have heartburn as a result of your pregnancy.  You may develop constipation because certain hormones are causing the muscles that push waste through your intestines to slow down.  You may develop hemorrhoids or swollen, bulging veins (varicose veins).  You may have pelvic pain because of the weight gain and pregnancy hormones relaxing your joints between the bones in your pelvis. Backaches may result from overexertion of the muscles supporting your posture.  You may have changes in your hair. These can include thickening of your hair, rapid growth, and changes in texture. Some women also have hair loss during or after pregnancy, or hair that feels dry or thin. Your hair will most likely return to normal after your baby is born.  Your breasts will continue to grow and be tender. A yellow discharge may leak from your breasts called colostrum.  Your belly button may stick out.  You may feel short of breath because of your expanding uterus.  You may notice the fetus "dropping," or moving lower in your abdomen.  You may have a bloody  mucus discharge. This usually occurs a few days to a week before labor begins.  Your cervix becomes thin and soft (effaced) near your due date. WHAT TO EXPECT AT YOUR PRENATAL EXAMS  You will have prenatal exams every 2 weeks until week 36. Then, you will have weekly prenatal exams. During a routine prenatal visit:  You will be weighed to make sure you and the fetus are growing normally.  Your blood pressure is taken.  Your abdomen will be measured to track your baby's growth.  The fetal heartbeat will be listened to.  Any test results from the previous visit will be discussed.  You may have a cervical check near your  due date to see if you have effaced. At around 36 weeks, your caregiver will check your cervix. At the same time, your caregiver will also perform a test on the secretions of the vaginal tissue. This test is to determine if a type of bacteria, Group B streptococcus, is present. Your caregiver will explain this further. Your caregiver may ask you:  What your birth plan is.  How you are feeling.  If you are feeling the baby move.  If you have had any abnormal symptoms, such as leaking fluid, bleeding, severe headaches, or abdominal cramping.  If you have any questions. Other tests or screenings that may be performed during your third trimester include:  Blood tests that check for low iron levels (anemia).  Fetal testing to check the health, activity level, and growth of the fetus. Testing is done if you have certain medical conditions or if there are problems during the pregnancy. FALSE LABOR You may feel small, irregular contractions that eventually go away. These are called Braxton Hicks contractions, or false labor. Contractions may last for hours, days, or even weeks before true labor sets in. If contractions come at regular intervals, intensify, or become painful, it is best to be seen by your caregiver.  SIGNS OF LABOR   Menstrual-like cramps.  Contractions that are 5 minutes apart or less.  Contractions that start on the top of the uterus and spread down to the lower abdomen and back.  A sense of increased pelvic pressure or back pain.  A watery or bloody mucus discharge that comes from the vagina. If you have any of these signs before the 37th week of pregnancy, call your caregiver right away. You need to go to the hospital to get checked immediately. HOME CARE INSTRUCTIONS   Avoid all smoking, herbs, alcohol, and unprescribed drugs. These chemicals affect the formation and growth of the baby.  Follow your caregiver's instructions regarding medicine use. There are medicines  that are either safe or unsafe to take during pregnancy.  Exercise only as directed by your caregiver. Experiencing uterine cramps is a good sign to stop exercising.  Continue to eat regular, healthy meals.  Wear a good support bra for breast tenderness.  Do not use hot tubs, steam rooms, or saunas.  Wear your seat belt at all times when driving.  Avoid raw meat, uncooked cheese, cat litter boxes, and soil used by cats. These carry germs that can cause birth defects in the baby.  Take your prenatal vitamins.  Try taking a stool softener (if your caregiver approves) if you develop constipation. Eat more high-fiber foods, such as fresh vegetables or fruit and whole grains. Drink plenty of fluids to keep your urine clear or pale yellow.  Take warm sitz baths to soothe any pain or discomfort caused by hemorrhoids.  Use hemorrhoid cream if your caregiver approves.  If you develop varicose veins, wear support hose. Elevate your feet for 15 minutes, 3-4 times a day. Limit salt in your diet.  Avoid heavy lifting, wear low heal shoes, and practice good posture.  Rest a lot with your legs elevated if you have leg cramps or low back pain.  Visit your dentist if you have not gone during your pregnancy. Use a soft toothbrush to brush your teeth and be gentle when you floss.  A sexual relationship may be continued unless your caregiver directs you otherwise.  Do not travel far distances unless it is absolutely necessary and only with the approval of your caregiver.  Take prenatal classes to understand, practice, and ask questions about the labor and delivery.  Make a trial run to the hospital.  Pack your hospital bag.  Prepare the baby's nursery.  Continue to go to all your prenatal visits as directed by your caregiver. SEEK MEDICAL CARE IF:  You are unsure if you are in labor or if your water has broken.  You have dizziness.  You have mild pelvic cramps, pelvic pressure, or nagging  pain in your abdominal area.  You have persistent nausea, vomiting, or diarrhea.  You have a bad smelling vaginal discharge.  You have pain with urination. SEEK IMMEDIATE MEDICAL CARE IF:   You have a fever.  You are leaking fluid from your vagina.  You have spotting or bleeding from your vagina.  You have severe abdominal cramping or pain.  You have rapid weight loss or gain.  You have shortness of breath with chest pain.  You notice sudden or extreme swelling of your face, hands, ankles, feet, or legs.  You have not felt your baby move in over an hour.  You have severe headaches that do not go away with medicine.  You have vision changes. Document Released: 10/23/2001 Document Revised: 11/03/2013 Document Reviewed: 12/30/2012 Exit[employer] Patient Information 2015 [employer]. This information is not intended to replace advice given to you by your health care provider. Make sure you discuss any questions you have with your health care provider.   

## 2018-01-21 LAB — GLUCOSE TOLERANCE, 2 HOURS W/ 1HR
GLUCOSE, 1 HOUR: 138 mg/dL (ref 65–179)
GLUCOSE, 2 HOUR: 140 mg/dL (ref 65–152)
Glucose, Fasting: 82 mg/dL (ref 65–91)

## 2018-01-21 LAB — CBC
HEMATOCRIT: 38.9 % (ref 34.0–46.6)
Hemoglobin: 13.4 g/dL (ref 11.1–15.9)
MCH: 30.4 pg (ref 26.6–33.0)
MCHC: 34.4 g/dL (ref 31.5–35.7)
MCV: 88 fL (ref 79–97)
PLATELETS: 189 10*3/uL (ref 150–379)
RBC: 4.41 x10E6/uL (ref 3.77–5.28)
RDW: 13.1 % (ref 12.3–15.4)
WBC: 10.8 10*3/uL (ref 3.4–10.8)

## 2018-01-21 LAB — HIV ANTIBODY (ROUTINE TESTING W REFLEX): HIV Screen 4th Generation wRfx: NONREACTIVE

## 2018-01-21 LAB — ANTIBODY SCREEN: ANTIBODY SCREEN: NEGATIVE

## 2018-01-21 LAB — RPR: RPR Ser Ql: NONREACTIVE

## 2018-02-10 ENCOUNTER — Encounter: Payer: Self-pay | Admitting: Obstetrics & Gynecology

## 2018-02-10 ENCOUNTER — Ambulatory Visit (INDEPENDENT_AMBULATORY_CARE_PROVIDER_SITE_OTHER): Payer: BLUE CROSS/BLUE SHIELD | Admitting: Obstetrics & Gynecology

## 2018-02-10 VITALS — BP 114/56 | HR 84 | Wt 133.0 lb

## 2018-02-10 DIAGNOSIS — Z331 Pregnant state, incidental: Secondary | ICD-10-CM

## 2018-02-10 DIAGNOSIS — Z3483 Encounter for supervision of other normal pregnancy, third trimester: Secondary | ICD-10-CM

## 2018-02-10 DIAGNOSIS — Z1389 Encounter for screening for other disorder: Secondary | ICD-10-CM

## 2018-02-10 DIAGNOSIS — O365930 Maternal care for other known or suspected poor fetal growth, third trimester, not applicable or unspecified: Secondary | ICD-10-CM

## 2018-02-10 DIAGNOSIS — Z3A30 30 weeks gestation of pregnancy: Secondary | ICD-10-CM

## 2018-02-10 LAB — POCT URINALYSIS DIPSTICK
Blood, UA: NEGATIVE
GLUCOSE UA: NEGATIVE
KETONES UA: NEGATIVE
Leukocytes, UA: NEGATIVE
Nitrite, UA: NEGATIVE
Protein, UA: NEGATIVE

## 2018-02-10 NOTE — Progress Notes (Signed)
G2P1001 552w2d Estimated Date of Delivery: 04/19/18  Blood pressure (!) 114/56, pulse 84, weight 133 lb (60.3 kg), last menstrual period 07/13/2017.   BP weight and urine results all reviewed and noted.  Please refer to the obstetrical flow sheet for the fundal height and fetal heart rate documentation:  Patient reports good fetal movement, denies any bleeding and no rupture of membranes symptoms or regular contractions. Patient is without complaints. All questions were answered.  Orders Placed This Encounter  Procedures  . US OB Follow Up  . POCT Urinalysis Dipstick    Plan:  Continued routine obstetrical care, FH 27 cm, will check EFW in 2 weeks, first baby was 7 lb 13 oz, does not smoke  Return in about 2 weeks (around 02/24/2018) for US for EFW, LROB.

## 2018-02-24 ENCOUNTER — Ambulatory Visit (INDEPENDENT_AMBULATORY_CARE_PROVIDER_SITE_OTHER): Payer: BLUE CROSS/BLUE SHIELD

## 2018-02-24 ENCOUNTER — Encounter: Payer: Self-pay | Admitting: Obstetrics and Gynecology

## 2018-02-24 ENCOUNTER — Ambulatory Visit (INDEPENDENT_AMBULATORY_CARE_PROVIDER_SITE_OTHER): Payer: BLUE CROSS/BLUE SHIELD | Admitting: Obstetrics and Gynecology

## 2018-02-24 VITALS — BP 120/60 | HR 98 | Wt 135.0 lb

## 2018-02-24 DIAGNOSIS — Z3403 Encounter for supervision of normal first pregnancy, third trimester: Secondary | ICD-10-CM

## 2018-02-24 DIAGNOSIS — O365930 Maternal care for other known or suspected poor fetal growth, third trimester, not applicable or unspecified: Secondary | ICD-10-CM | POA: Diagnosis not present

## 2018-02-24 DIAGNOSIS — Z3483 Encounter for supervision of other normal pregnancy, third trimester: Secondary | ICD-10-CM

## 2018-02-24 DIAGNOSIS — Z3A32 32 weeks gestation of pregnancy: Secondary | ICD-10-CM

## 2018-02-24 DIAGNOSIS — Z331 Pregnant state, incidental: Secondary | ICD-10-CM

## 2018-02-24 DIAGNOSIS — Z1389 Encounter for screening for other disorder: Secondary | ICD-10-CM

## 2018-02-24 LAB — POCT URINALYSIS DIPSTICK
Blood, UA: NEGATIVE
GLUCOSE UA: NEGATIVE
Glucose, UA: NEGATIVE
KETONES UA: NEGATIVE
Ketones, UA: NEGATIVE
LEUKOCYTES UA: NEGATIVE
Leukocytes, UA: NEGATIVE
Nitrite, UA: NEGATIVE
Nitrite, UA: NEGATIVE
Protein, UA: NEGATIVE
Protein, UA: NEGATIVE
RBC UA: NEGATIVE

## 2018-02-24 NOTE — Progress Notes (Signed)
   LOW-RISK PREGNANCY VISIT Patient name: Shelia Brown MRN [id number]  Date of birth: 1991-05-21 Chief Complaint:   Routine Prenatal Visit (ultrasound)  History of Present Illness:   Shelia Brown is a 27 y.o. 352P1001 female at 7261w2d with an Estimated Date of Delivery: 04/19/18 being seen today for ongoing management of a low-risk pregnancy.  Today she reports contractions that occur at work. Contractions: Irregular.  .  Movement: Present. denies leaking of fluid. Review of Systems:   Pertinent items are noted in HPI Denies abnormal vaginal discharge w/ itching/odor/irritation, headaches, visual changes, shortness of breath, chest pain, abdominal pain, severe nausea/vomiting, or problems with urination or bowel movements unless otherwise stated above. Pertinent History Reviewed:  Reviewed past medical,surgical, social, obstetrical and family history.  Reviewed problem list, medications and allergies. Physical Assessment:   Vitals:   02/24/18 0907  BP: 120/60  Pulse: 98  There is no height or weight on file to calculate BMI.        Physical Examination:   General appearance: Well appearing, and in no distress  Mental status: Alert, oriented to person, place, and time  Skin: Warm & dry  Cardiovascular: Normal heart rate noted  Respiratory: Normal respiratory effort, no distress  Abdomen: Soft, gravid, nontender  Pelvic: Cervical exam deferred         Extremities: Edema: None  Fetal Status: Fetal Heart Rate (bpm): 135 u/s Fundal Height: 29 cm Movement: Present    Results for orders placed or performed in visit on 02/24/18 (from the past 24 hour(s))  POCT urinalysis dipstick   Collection Time: 02/24/18  9:12 AM  Result Value Ref Range   Color, UA     Clarity, UA     Glucose, UA neg    Bilirubin, UA     Ketones, UA neg    Spec Grav, UA  1.010 - 1.025   Blood, UA neg    pH, UA  5.0 - 8.0   Protein, UA neg    Urobilinogen, UA  0.2 or 1.0 E.U./dL   Nitrite, UA neg    Leukocytes, UA Negative Negative   Appearance     Odor      Assessment & Plan:  1) Low-risk pregnancy G2P1001 at 7961w2d with an Estimated Date of Delivery: 04/19/18    Meds: No orders of the defined types were placed in this encounter.  Labs/procedures today: US 32+2 wks,cephalic,fhr 135 bpm,posterior placenta gr 3,afi 11.5 cm,normal ovaries bilat,EFW 1985 g 46%  Plan:  Continue routine obstetrical care   Reviewed: Preterm labor symptoms and general obstetric precautions including but not limited to vaginal bleeding, contractions, leaking of fluid and fetal movement were reviewed in detail with the patient.  All questions were answered  Follow-up: Return in about 2 weeks (around 03/10/2018), or if symptoms worsen or fail to improve, for LROB.  Orders Placed This Encounter  Procedures  . POCT urinalysis dipstick    By signing my name below, I, Izna Ahmed, attest that this documentation has been prepared under the direction and in the presence of Tilda BurrowFerguson, Shaquilla Kehres V, MD. Electronically Signed: Redge GainerIzna Ahmed, Medical Scribe. 02/24/18. 9:18 AM.  I personally performed the services described in this documentation, which was SCRIBED in my presence. The recorded information has been reviewed and considered accurate. It has been edited as necessary during review. Tilda BurrowJohn V Demitrios Molyneux, MD

## 2018-02-24 NOTE — Progress Notes (Signed)
US 32+2 wks,cephalic,fhr 135 bpm,posterior placenta gr 3,afi 11.5 cm,normal ovaries bilat,EFW 1985 g 46%

## 2018-02-26 ENCOUNTER — Other Ambulatory Visit: Payer: Self-pay

## 2018-03-10 ENCOUNTER — Ambulatory Visit (INDEPENDENT_AMBULATORY_CARE_PROVIDER_SITE_OTHER): Payer: BLUE CROSS/BLUE SHIELD | Admitting: Women's Health

## 2018-03-10 ENCOUNTER — Encounter: Payer: Self-pay | Admitting: Women's Health

## 2018-03-10 ENCOUNTER — Other Ambulatory Visit: Payer: Self-pay

## 2018-03-10 VITALS — BP 94/50 | HR 87 | Wt 136.0 lb

## 2018-03-10 DIAGNOSIS — Z331 Pregnant state, incidental: Secondary | ICD-10-CM

## 2018-03-10 DIAGNOSIS — Z1389 Encounter for screening for other disorder: Secondary | ICD-10-CM

## 2018-03-10 DIAGNOSIS — Z3A34 34 weeks gestation of pregnancy: Secondary | ICD-10-CM

## 2018-03-10 DIAGNOSIS — Z3483 Encounter for supervision of other normal pregnancy, third trimester: Secondary | ICD-10-CM

## 2018-03-10 DIAGNOSIS — O26843 Uterine size-date discrepancy, third trimester: Secondary | ICD-10-CM

## 2018-03-10 LAB — POCT URINALYSIS DIPSTICK
Blood, UA: NEGATIVE
GLUCOSE UA: NEGATIVE
KETONES UA: NEGATIVE
Leukocytes, UA: NEGATIVE
Nitrite, UA: NEGATIVE
Protein, UA: NEGATIVE

## 2018-03-10 NOTE — Patient Instructions (Signed)
Georgiann Cocker, I greatly value your feedback.  If you receive a survey following your visit with [place] today, we appreciate you taking the time to fill it out.  Thanks, Joellyn Haff, CNM, WHNP-BC   Call the office [phone]) or go to [facility] if:  You begin to have strong, frequent contractions  Your water breaks.  Sometimes it is a big gush of fluid, sometimes it is just a trickle that keeps getting your panties wet or running down your legs  You have vaginal bleeding.  It is normal to have a small amount of spotting if your cervix was checked.   You don't feel your baby moving like normal.  If you don't, get you something to eat and drink and lay down and focus on feeling your baby move.  You should feel at least 10 movements in 2 hours.  If you don't, you should call the office or go to [facility].    Preterm Labor and Birth Information The normal length of a pregnancy is 39-41 weeks. Preterm labor is when labor starts before 37 completed weeks of pregnancy. What are the risk factors for preterm labor? Preterm labor is more likely to occur in women who:  Have certain infections during pregnancy such as a bladder infection, sexually transmitted infection, or infection inside the uterus (chorioamnionitis).  Have a shorter-than-normal cervix.  Have gone into preterm labor before.  Have had surgery on their cervix.  Are younger than age 62 or older than age 35.  Are African American.  Are pregnant with twins or multiple babies (multiple gestation).  Take street drugs or smoke while pregnant.  Do not gain enough weight while pregnant.  Became pregnant shortly after having been pregnant.  What are the symptoms of preterm labor? Symptoms of preterm labor include:  Cramps similar to those that can happen during a menstrual period. The cramps may happen with diarrhea.  Pain in the abdomen or lower back.  Regular uterine contractions that may feel like tightening of  the abdomen.  A feeling of increased pressure in the pelvis.  Increased watery or bloody mucus discharge from the vagina.  Water breaking (ruptured amniotic sac).  Why is it important to recognize signs of preterm labor? It is important to recognize signs of preterm labor because babies who are born prematurely may not be fully developed. This can put them at an increased risk for:  Long-term (chronic) heart and lung problems.  Difficulty immediately after birth with regulating body systems, including blood sugar, body temperature, heart rate, and breathing rate.  Bleeding in the brain.  Cerebral palsy.  Learning difficulties.  Death.  These risks are highest for babies who are born before 34 weeks of pregnancy. How is preterm labor treated? Treatment depends on the length of your pregnancy, your condition, and the health of your baby. It may involve:  Having a stitch (suture) placed in your cervix to prevent your cervix from opening too early (cerclage).  Taking or being given medicines, such as: ? Hormone medicines. These may be given early in pregnancy to help support the pregnancy. ? Medicine to stop contractions. ? Medicines to help mature the baby's lungs. These may be prescribed if the risk of delivery is high. ? Medicines to prevent your baby from developing cerebral palsy.  If the labor happens before 34 weeks of pregnancy, you may need to stay in the hospital. What should I do if I think I am in preterm labor? If you  think that you are going into preterm labor, call your health care provider right away. How can I prevent preterm labor in future pregnancies? To increase your chance of having a full-term pregnancy:  Do not use any tobacco products, such as cigarettes, chewing tobacco, and e-cigarettes. If you need help quitting, ask your health care provider.  Do not use street drugs or medicines that have not been prescribed to you during your pregnancy.  Talk  with your health care provider before taking any herbal supplements, even if you have been taking them regularly.  Make sure you gain a healthy amount of weight during your pregnancy.  Watch for infection. If you think that you might have an infection, get it checked right away.  Make sure to tell your health care provider if you have gone into preterm labor before.  This information is not intended to replace advice given to you by your health care provider. Make sure you discuss any questions you have with your health care provider. Document Released: 01/19/2004 Document Revised: 04/10/2016 Document Reviewed: 03/21/2016 Elsevier Interactive Patient Education  2018 [employer].

## 2018-03-10 NOTE — Progress Notes (Signed)
neg

## 2018-03-10 NOTE — Progress Notes (Signed)
   LOW-RISK PREGNANCY VISIT Patient name: Shelia Brown MRN [id number]  Date of birth: 26-Jul-1991 Chief Complaint:   Routine Prenatal Visit  History of Present Illness:   Shelia Brown is a 27 y.o. G21P1001 female at [redacted]w[redacted]d with an Estimated Date of Delivery: 04/19/18 being seen today for ongoing management of a low-risk pregnancy.  Today she reports had some cramping at work the other day, none now. Contractions: Irregular. Vag. Bleeding: None.  Movement: Present. denies leaking of fluid. Review of Systems:   Pertinent items are noted in HPI Denies abnormal vaginal discharge w/ itching/odor/irritation, headaches, visual changes, shortness of breath, chest pain, abdominal pain, severe nausea/vomiting, or problems with urination or bowel movements unless otherwise stated above. Pertinent History Reviewed:  Reviewed past medical,surgical, social, obstetrical and family history.  Reviewed problem list, medications and allergies. Physical Assessment:   Vitals:   03/10/18 1030  BP: (!) 94/50  Pulse: 87  Weight: 136 lb (61.7 kg)  Body mass index is 21.95 kg/m.        Physical Examination:   General appearance: Well appearing, and in no distress  Mental status: Alert, oriented to person, place, and time  Skin: Warm & dry  Cardiovascular: Normal heart rate noted  Respiratory: Normal respiratory effort, no distress  Abdomen: Soft, gravid, nontender  Pelvic: Cervical exam deferred         Extremities: Edema: Trace  Fetal Status: Fetal Heart Rate (bpm): 137 Fundal Height: 30 cm Movement: Present    Results for orders placed or performed in visit on 03/10/18 (from the past 24 hour(s))  POCT urinalysis dipstick   Collection Time: 03/10/18 10:34 AM  Result Value Ref Range   Color, UA     Clarity, UA     Glucose, UA neg    Bilirubin, UA     Ketones, UA neg    Spec Grav, UA  1.010 - 1.025   Blood, UA neg    pH, UA  5.0 - 8.0   Protein, UA neg    Urobilinogen, UA  0.2 or 1.0 E.U./dL   Nitrite, UA neg    Leukocytes, UA Negative Negative   Appearance     Odor      Assessment & Plan:  1) Low-risk pregnancy G2P1001 at [redacted]w[redacted]d with an Estimated Date of Delivery: 04/19/18   2) Persistent uterine size < dates, last u/s 4/15 efw 46%, AFI 11.5cm   Meds: No orders of the defined types were placed in this encounter.  Labs/procedures today: none  Plan:  Continue routine obstetrical care   Reviewed: Preterm labor symptoms and general obstetric precautions including but not limited to vaginal bleeding, contractions, leaking of fluid and fetal movement were reviewed in detail with the patient.  All questions were answered  Follow-up: Return in about 2 weeks (around 03/24/2018) for LROB, US:EFW.  Orders Placed This Encounter  Procedures  . US OB Follow Up  . POCT urinalysis dipstick   Cheral Marker CNM, [facility] 03/10/2018 11:04 AM

## 2018-03-25 ENCOUNTER — Encounter: Payer: Self-pay | Admitting: Obstetrics & Gynecology

## 2018-03-25 ENCOUNTER — Ambulatory Visit (INDEPENDENT_AMBULATORY_CARE_PROVIDER_SITE_OTHER): Payer: BLUE CROSS/BLUE SHIELD | Admitting: Obstetrics & Gynecology

## 2018-03-25 ENCOUNTER — Ambulatory Visit (INDEPENDENT_AMBULATORY_CARE_PROVIDER_SITE_OTHER): Payer: BLUE CROSS/BLUE SHIELD

## 2018-03-25 VITALS — BP 96/62 | HR 87 | Wt 142.0 lb

## 2018-03-25 DIAGNOSIS — O26843 Uterine size-date discrepancy, third trimester: Secondary | ICD-10-CM

## 2018-03-25 DIAGNOSIS — Z331 Pregnant state, incidental: Secondary | ICD-10-CM

## 2018-03-25 DIAGNOSIS — Z3A36 36 weeks gestation of pregnancy: Secondary | ICD-10-CM | POA: Diagnosis not present

## 2018-03-25 DIAGNOSIS — Z3483 Encounter for supervision of other normal pregnancy, third trimester: Secondary | ICD-10-CM

## 2018-03-25 DIAGNOSIS — Z3403 Encounter for supervision of normal first pregnancy, third trimester: Secondary | ICD-10-CM

## 2018-03-25 DIAGNOSIS — Z1389 Encounter for screening for other disorder: Secondary | ICD-10-CM

## 2018-03-25 LAB — POCT URINALYSIS DIPSTICK
Blood, UA: NEGATIVE
GLUCOSE UA: NEGATIVE
Leukocytes, UA: NEGATIVE
NITRITE UA: NEGATIVE
Protein, UA: NEGATIVE

## 2018-03-25 NOTE — Progress Notes (Signed)
G2P1001 [redacted]w[redacted]d Estimated Date of Delivery: 04/19/18  Blood pressure 96/62, pulse 87, weight 142 lb (64.4 kg), last menstrual period 07/13/2017.   BP weight and urine results all reviewed and noted.  Please refer to the obstetrical flow sheet for the fundal height and fetal heart rate documentation:  Patient reports good fetal movement, denies any bleeding and no rupture of membranes symptoms or regular contractions. Patient is without complaints. All questions were answered.  Orders Placed This Encounter  Procedures  . POCT urinalysis dipstick    Plan:  Continued routine obstetrical care,   Return in about 1 week (around 04/01/2018) for LROB.

## 2018-03-25 NOTE — Progress Notes (Signed)
[place] 36+3 wks,cephalic,posterior pl gr 3,normal ovaries bilat,afi 9 cm,fhr 142 bpm,EFW 2901 g 50%

## 2018-04-01 ENCOUNTER — Encounter: Payer: Self-pay | Admitting: Advanced Practice Midwife

## 2018-04-01 ENCOUNTER — Ambulatory Visit (INDEPENDENT_AMBULATORY_CARE_PROVIDER_SITE_OTHER): Payer: BLUE CROSS/BLUE SHIELD | Admitting: Advanced Practice Midwife

## 2018-04-01 VITALS — BP 130/78 | HR 91 | Wt 141.5 lb

## 2018-04-01 DIAGNOSIS — Z23 Encounter for immunization: Secondary | ICD-10-CM | POA: Diagnosis not present

## 2018-04-01 DIAGNOSIS — Z3403 Encounter for supervision of normal first pregnancy, third trimester: Secondary | ICD-10-CM

## 2018-04-01 DIAGNOSIS — Z331 Pregnant state, incidental: Secondary | ICD-10-CM

## 2018-04-01 DIAGNOSIS — Z1389 Encounter for screening for other disorder: Secondary | ICD-10-CM

## 2018-04-01 DIAGNOSIS — Z3A37 37 weeks gestation of pregnancy: Secondary | ICD-10-CM

## 2018-04-01 DIAGNOSIS — Z3483 Encounter for supervision of other normal pregnancy, third trimester: Secondary | ICD-10-CM

## 2018-04-01 LAB — POCT URINALYSIS DIPSTICK
Blood, UA: NEGATIVE
Glucose, UA: NEGATIVE
KETONES UA: NEGATIVE
LEUKOCYTES UA: NEGATIVE
NITRITE UA: NEGATIVE
PROTEIN UA: NEGATIVE

## 2018-04-01 LAB — OB RESULTS CONSOLE GBS: GBS: NEGATIVE

## 2018-04-01 NOTE — Progress Notes (Signed)
  G2P1001 [redacted]w[redacted]d Estimated Date of Delivery: 04/19/18  Blood pressure 130/78, pulse 91, weight 141 lb 8 oz (64.2 kg), last menstrual period 07/13/2017.   BP weight and urine results all reviewed and noted.  Please refer to the obstetrical flow sheet for the fundal height and fetal heart rate documentation:  Patient reports good fetal movement, denies any bleeding and no rupture of membranes symptoms or regular contractions. Patient is without complaints. All questions were answered.   Physical Assessment:   Vitals:   04/01/18 1132  BP: 130/78  Pulse: 91  Weight: 141 lb 8 oz (64.2 kg)  Body mass index is 22.84 kg/m.        Physical Examination:   General appearance: Well appearing, and in no distress  Mental status: Alert, oriented to person, place, and time  Skin: Warm & dry  Cardiovascular: Normal heart rate noted  Respiratory: Normal respiratory effort, no distress  Abdomen: Soft, gravid, nontender  Pelvic: Cervical exam performed  Dilation: 3.5 Effacement (%): 60 Station: -2  Extremities: Edema: Trace  Fetal Status: Fetal Heart Rate (bpm): 135 Fundal Height: 33 cm Movement: Present Presentation: Vertex  Results for orders placed or performed in visit on 04/01/18 (from the past 24 hour(s))  POCT urinalysis dipstick   Collection Time: 04/01/18 11:33 AM  Result Value Ref Range   Color, UA     Clarity, UA     Glucose, UA Negative Negative   Bilirubin, UA     Ketones, UA neg    Spec Grav, UA  1.010 - 1.025   Blood, UA neg    pH, UA  5.0 - 8.0   Protein, UA Negative Negative   Urobilinogen, UA  0.2 or 1.0 E.U./dL   Nitrite, UA neg    Leukocytes, UA Negative Negative   Appearance     Odor       Orders Placed This Encounter  Procedures  . GC/Chlamydia Probe Amp  . Strep Gp B NAA  . Tdap vaccine greater than or equal to 7yo IM  . POCT urinalysis dipstick    Plan:  Continued routine obstetrical care,   Return in about 1 week (around 04/08/2018) for LROB.

## 2018-04-01 NOTE — Patient Instructions (Addendum)

## 2018-04-03 LAB — STREP GP B NAA: STREP GROUP B AG: NEGATIVE

## 2018-04-03 LAB — GC/CHLAMYDIA PROBE AMP
Chlamydia trachomatis, NAA: NEGATIVE
Neisseria gonorrhoeae by PCR: NEGATIVE

## 2018-04-08 ENCOUNTER — Encounter: Payer: Self-pay | Admitting: Women's Health

## 2018-04-08 ENCOUNTER — Ambulatory Visit (INDEPENDENT_AMBULATORY_CARE_PROVIDER_SITE_OTHER): Payer: BLUE CROSS/BLUE SHIELD | Admitting: Women's Health

## 2018-04-08 VITALS — BP 120/60 | HR 83 | Wt 140.4 lb

## 2018-04-08 DIAGNOSIS — Z1389 Encounter for screening for other disorder: Secondary | ICD-10-CM

## 2018-04-08 DIAGNOSIS — Z331 Pregnant state, incidental: Secondary | ICD-10-CM

## 2018-04-08 DIAGNOSIS — Z3483 Encounter for supervision of other normal pregnancy, third trimester: Secondary | ICD-10-CM

## 2018-04-08 DIAGNOSIS — Z3A38 38 weeks gestation of pregnancy: Secondary | ICD-10-CM

## 2018-04-08 LAB — POCT URINALYSIS DIPSTICK
GLUCOSE UA: NEGATIVE
Ketones, UA: NEGATIVE
Nitrite, UA: NEGATIVE
Protein, UA: NEGATIVE
RBC UA: NEGATIVE

## 2018-04-08 NOTE — Progress Notes (Signed)
   LOW-RISK PREGNANCY VISIT Patient name: Shelia Brown MRN [id number]  Date of birth: Nov 24, 1990 Chief Complaint:   Routine Prenatal Visit (low stomach pain)  History of Present Illness:   Shelia Brown is a 27 y.o. G74P1001 female at [redacted]w[redacted]d with an Estimated Date of Delivery: 04/19/18 being seen today for ongoing management of a low-risk pregnancy.  Today she reports some pressure, occ uc's. Contractions: Irregular. Vag. Bleeding: None.  Movement: Present. denies leaking of fluid. Review of Systems:   Pertinent items are noted in HPI Denies abnormal vaginal discharge w/ itching/odor/irritation, headaches, visual changes, shortness of breath, chest pain, abdominal pain, severe nausea/vomiting, or problems with urination or bowel movements unless otherwise stated above. Pertinent History Reviewed:  Reviewed past medical,surgical, social, obstetrical and family history.  Reviewed problem list, medications and allergies. Physical Assessment:   Vitals:   04/08/18 0844  BP: 120/60  Pulse: 83  Weight: 140 lb 6.4 oz (63.7 kg)  Body mass index is 22.66 kg/m.        Physical Examination:   General appearance: Well appearing, and in no distress  Mental status: Alert, oriented to person, place, and time  Skin: Warm & dry  Cardiovascular: Normal heart rate noted  Respiratory: Normal respiratory effort, no distress  Abdomen: Soft, gravid, nontender  Pelvic: Cervical exam performed  Dilation: 3.5 Effacement (%): 60 Station: -2  Extremities: Edema: None  Fetal Status: Fetal Heart Rate (bpm): 147 Fundal Height: 34 cm Movement: Present Presentation: Vertex  Results for orders placed or performed in visit on 04/08/18 (from the past 24 hour(s))  POCT urinalysis dipstick   Collection Time: 04/08/18  8:45 AM  Result Value Ref Range   Color, UA     Clarity, UA     Glucose, UA Negative Negative   Bilirubin, UA     Ketones, UA neg    Spec Grav, UA  1.010 - 1.025   Blood, UA neg    pH, UA  5.0 -  8.0   Protein, UA Negative Negative   Urobilinogen, UA  0.2 or 1.0 E.U./dL   Nitrite, UA neg    Leukocytes, UA Trace (A) Negative   Appearance     Odor      Assessment & Plan:  1) Low-risk pregnancy G2P1001 at [redacted]w[redacted]d with an Estimated Date of Delivery: 04/19/18    Meds: No orders of the defined types were placed in this encounter.  Labs/procedures today: sve  Plan:  Continue routine obstetrical care   Reviewed: Term labor symptoms and general obstetric precautions including but not limited to vaginal bleeding, contractions, leaking of fluid and fetal movement were reviewed in detail with the patient.  All questions were answered  Follow-up: Return in about 1 week (around 04/15/2018) for LROB.  Orders Placed This Encounter  Procedures  . POCT urinalysis dipstick   Cheral Marker CNM, WHNP-BC 04/08/2018 9:00 AM

## 2018-04-08 NOTE — Patient Instructions (Signed)
Shelia Brown, I greatly value your feedback.  If you receive a survey following your visit with [place] today, we appreciate you taking the time to fill it out.  Thanks, Joellyn Haff, CNM, WHNP-BC   Call the office [phone]) or go to [facility] if:  You begin to have strong, frequent contractions  Your water breaks.  Sometimes it is a big gush of fluid, sometimes it is just a trickle that keeps getting your panties wet or running down your legs  You have vaginal bleeding.  It is normal to have a small amount of spotting if your cervix was checked.   You don't feel your baby moving like normal.  If you don't, get you something to eat and drink and lay down and focus on feeling your baby move.  You should feel at least 10 movements in 2 hours.  If you don't, you should call the office or go to [facility].   [facility] Contractions Contractions of the uterus can occur throughout pregnancy, but they are not always a sign that you are in labor. You may have practice contractions called Braxton Hicks contractions. These false labor contractions are sometimes confused with true labor. What are Deberah Pelton contractions? Braxton Hicks contractions are tightening movements that occur in the muscles of the uterus before labor. Unlike true labor contractions, these contractions do not result in opening (dilation) and thinning of the cervix. Toward the end of pregnancy (32-34 weeks), Braxton Hicks contractions can happen more often and may become stronger. These contractions are sometimes difficult to tell apart from true labor because they can be very uncomfortable. You should not feel embarrassed if you go to the hospital with false labor. Sometimes, the only way to tell if you are in true labor is for your health care provider to look for changes in the cervix. The health care provider will do a physical exam and may monitor your contractions. If you are not in true labor, the exam should show  that your cervix is not dilating and your water has not broken. If there are other health problems associated with your pregnancy, it is completely safe for you to be sent home with false labor. You may continue to have Braxton Hicks contractions until you go into true labor. How to tell the difference between true labor and false labor True labor  Contractions last 30-70 seconds.  Contractions become very regular.  Discomfort is usually felt in the top of the uterus, and it spreads to the lower abdomen and low back.  Contractions do not go away with walking.  Contractions usually become more intense and increase in frequency.  The cervix dilates and gets thinner. False labor  Contractions are usually shorter and not as strong as true labor contractions.  Contractions are usually irregular.  Contractions are often felt in the front of the lower abdomen and in the groin.  Contractions may go away when you walk around or change positions while lying down.  Contractions get weaker and are shorter-lasting as time goes on.  The cervix usually does not dilate or become thin. Follow these instructions at home:  Take over-the-counter and prescription medicines only as told by your health care provider.  Keep up with your usual exercises and follow other instructions from your health care provider.  Eat and drink lightly if you think you are going into labor.  If Braxton Hicks contractions are making you uncomfortable: ? Change your position from lying down or  resting to walking, or change from walking to resting. ? Sit and rest in a tub of warm water. ? Drink enough fluid to keep your urine pale yellow. Dehydration may cause these contractions. ? Do slow and deep breathing several times an hour.  Keep all follow-up prenatal visits as told by your health care provider. This is important. Contact a health care provider if:  You have a fever.  You have continuous pain in your  abdomen. Get help right away if:  Your contractions become stronger, more regular, and closer together.  You have fluid leaking or gushing from your vagina.  You pass blood-tinged mucus (bloody show).  You have bleeding from your vagina.  You have low back pain that you never had before.  You feel your baby's head pushing down and causing pelvic pressure.  Your baby is not moving inside you as much as it used to. Summary  Contractions that occur before labor are called Braxton Hicks contractions, false labor, or practice contractions.  Braxton Hicks contractions are usually shorter, weaker, farther apart, and less regular than true labor contractions. True labor contractions usually become progressively stronger and regular and they become more frequent.  Manage discomfort from [facility] contractions by changing position, resting in a warm bath, drinking plenty of water, or practicing deep breathing. This information is not intended to replace advice given to you by your health care provider. Make sure you discuss any questions you have with your health care provider. Document Released: 03/14/2017 Document Revised: 03/14/2017 Document Reviewed: 03/14/2017 Elsevier Interactive Patient Education  2018 [employer]

## 2018-04-15 ENCOUNTER — Inpatient Hospital Stay (HOSPITAL_COMMUNITY)
Admission: AD | Admit: 2018-04-15 | Discharge: 2018-04-16 | DRG: 806 | Disposition: A | Discharge status: Home / Self Care | Payer: BLUE CROSS/BLUE SHIELD | Attending: Family Medicine | Admitting: Family Medicine

## 2018-04-15 ENCOUNTER — Telehealth: Payer: Self-pay | Admitting: *Deleted

## 2018-04-15 ENCOUNTER — Ambulatory Visit (INDEPENDENT_AMBULATORY_CARE_PROVIDER_SITE_OTHER): Payer: BLUE CROSS/BLUE SHIELD | Admitting: Advanced Practice Midwife

## 2018-04-15 ENCOUNTER — Other Ambulatory Visit: Payer: Self-pay

## 2018-04-15 ENCOUNTER — Encounter (HOSPITAL_COMMUNITY): Payer: Self-pay | Admitting: *Deleted

## 2018-04-15 DIAGNOSIS — Z3A39 39 weeks gestation of pregnancy: Secondary | ICD-10-CM

## 2018-04-15 DIAGNOSIS — O99324 Drug use complicating childbirth: Secondary | ICD-10-CM | POA: Diagnosis present

## 2018-04-15 DIAGNOSIS — Z87891 Personal history of nicotine dependence: Secondary | ICD-10-CM | POA: Diagnosis not present

## 2018-04-15 DIAGNOSIS — F1290 Cannabis use, unspecified, uncomplicated: Secondary | ICD-10-CM | POA: Diagnosis present

## 2018-04-15 DIAGNOSIS — Z3403 Encounter for supervision of normal first pregnancy, third trimester: Secondary | ICD-10-CM

## 2018-04-15 DIAGNOSIS — Z3483 Encounter for supervision of other normal pregnancy, third trimester: Secondary | ICD-10-CM

## 2018-04-15 LAB — CBC
HEMATOCRIT: 41.1 % (ref 36.0–46.0)
Hemoglobin: 14.4 g/dL (ref 12.0–15.0)
MCH: 31 pg (ref 26.0–34.0)
MCHC: 35 g/dL (ref 30.0–36.0)
MCV: 88.6 fL (ref 78.0–100.0)
Platelets: 163 10*3/uL (ref 150–400)
RBC: 4.64 MIL/uL (ref 3.87–5.11)
RDW: 13 % (ref 11.5–15.5)
WBC: 21.6 10*3/uL — AB (ref 4.0–10.5)

## 2018-04-15 LAB — TYPE AND SCREEN
ABO/RH(D): O POS
ANTIBODY SCREEN: NEGATIVE

## 2018-04-15 LAB — ABO/RH: ABO/RH(D): O POS

## 2018-04-15 MED ORDER — OXYCODONE-ACETAMINOPHEN 5-325 MG PO TABS: 1.0000 | ORAL_TABLET | ORAL | Status: DC | PRN

## 2018-04-15 MED ORDER — SIMETHICONE 80 MG PO CHEW: 80.0000 mg | CHEWABLE_TABLET | ORAL | Status: DC | PRN

## 2018-04-15 MED ORDER — PRENATAL MULTIVITAMIN CH
1.0000 | ORAL_TABLET | Freq: Every day | ORAL | Status: DC
  Administered 2018-04-16: 1 via ORAL
  Filled 2018-04-15: qty 1

## 2018-04-15 MED ORDER — DIBUCAINE 1 % RE OINT: 1.0000 "application " | TOPICAL_OINTMENT | RECTAL | Status: DC | PRN

## 2018-04-15 MED ORDER — SOD CITRATE-CITRIC ACID 500-334 MG/5ML PO SOLN: 30.0000 mL | ORAL | Status: DC | PRN

## 2018-04-15 MED ORDER — LIDOCAINE HCL (PF) 1 % IJ SOLN
INTRAMUSCULAR | Status: AC
  Filled 2018-04-15: qty 30

## 2018-04-15 MED ORDER — OXYTOCIN 40 UNITS IN LACTATED RINGERS INFUSION - SIMPLE MED
INTRAVENOUS | Status: AC
  Filled 2018-04-15: qty 1000

## 2018-04-15 MED ORDER — OXYCODONE-ACETAMINOPHEN 5-325 MG PO TABS
2.0000 | ORAL_TABLET | ORAL | Status: DC | PRN
  Administered 2018-04-15: 2 via ORAL
  Filled 2018-04-15: qty 2

## 2018-04-15 MED ORDER — COCONUT OIL OIL: 1.0000 "application " | TOPICAL_OIL | Status: DC | PRN

## 2018-04-15 MED ORDER — IBUPROFEN 600 MG PO TABS
600.0000 mg | ORAL_TABLET | Freq: Four times a day (QID) | ORAL | Status: DC
  Administered 2018-04-15 – 2018-04-16 (×5): 600 mg via ORAL
  Filled 2018-04-15 (×5): qty 1

## 2018-04-15 MED ORDER — ZOLPIDEM TARTRATE 5 MG PO TABS: 5.0000 mg | ORAL_TABLET | Freq: Every evening | ORAL | Status: DC | PRN

## 2018-04-15 MED ORDER — ONDANSETRON HCL 4 MG/2ML IJ SOLN: 4.0000 mg | Freq: Four times a day (QID) | INTRAMUSCULAR | Status: DC | PRN

## 2018-04-15 MED ORDER — LACTATED RINGERS IV SOLN: 500.0000 mL | INTRAVENOUS | Status: DC | PRN

## 2018-04-15 MED ORDER — ONDANSETRON HCL 4 MG/2ML IJ SOLN: 4.0000 mg | INTRAMUSCULAR | Status: DC | PRN

## 2018-04-15 MED ORDER — OXYTOCIN BOLUS FROM INFUSION
500.0000 mL | Freq: Once | INTRAVENOUS | Status: AC
  Administered 2018-04-15: 500 mL via INTRAVENOUS

## 2018-04-15 MED ORDER — LIDOCAINE HCL (PF) 1 % IJ SOLN
30.0000 mL | INTRAMUSCULAR | Status: DC | PRN
  Filled 2018-04-15: qty 30

## 2018-04-15 MED ORDER — BENZOCAINE-MENTHOL 20-0.5 % EX AERO
1.0000 "application " | INHALATION_SPRAY | CUTANEOUS | Status: DC | PRN
  Administered 2018-04-15: 1 via TOPICAL
  Filled 2018-04-15: qty 56

## 2018-04-15 MED ORDER — TETANUS-DIPHTH-ACELL PERTUSSIS 5-2.5-18.5 LF-MCG/0.5 IM SUSP: 0.5000 mL | Freq: Once | INTRAMUSCULAR | Status: DC

## 2018-04-15 MED ORDER — LACTATED RINGERS IV SOLN
INTRAVENOUS | Status: DC
  Administered 2018-04-15: 11:00:00 via INTRAVENOUS

## 2018-04-15 MED ORDER — DIPHENHYDRAMINE HCL 25 MG PO CAPS: 25.0000 mg | ORAL_CAPSULE | Freq: Four times a day (QID) | ORAL | Status: DC | PRN

## 2018-04-15 MED ORDER — ACETAMINOPHEN 325 MG PO TABS: 650.0000 mg | ORAL_TABLET | ORAL | Status: DC | PRN

## 2018-04-15 MED ORDER — WITCH HAZEL-GLYCERIN EX PADS: 1.0000 "application " | MEDICATED_PAD | CUTANEOUS | Status: DC | PRN

## 2018-04-15 MED ORDER — ONDANSETRON HCL 4 MG PO TABS: 4.0000 mg | ORAL_TABLET | ORAL | Status: DC | PRN

## 2018-04-15 MED ORDER — OXYTOCIN 40 UNITS IN LACTATED RINGERS INFUSION - SIMPLE MED
2.5000 [IU]/h | INTRAVENOUS | Status: DC
  Administered 2018-04-15: 2.5 [IU]/h via INTRAVENOUS

## 2018-04-15 MED ORDER — ACETAMINOPHEN 325 MG PO TABS
650.0000 mg | ORAL_TABLET | ORAL | Status: DC | PRN
  Administered 2018-04-15 – 2018-04-16 (×2): 650 mg via ORAL
  Filled 2018-04-15 (×2): qty 2

## 2018-04-15 MED ORDER — SENNOSIDES-DOCUSATE SODIUM 8.6-50 MG PO TABS
2.0000 | ORAL_TABLET | ORAL | Status: DC
  Administered 2018-04-16: 2 via ORAL
  Filled 2018-04-15: qty 2

## 2018-04-15 NOTE — Progress Notes (Signed)
Pt in for labor check. Started contracting at 0800.  cx 9/100/-2.  BOW intact.  Ctx q 3-4 minutes.  FHR 130s. Discussed w/[facility] regarding transport.  EMS personal agree w/transport, did not feel need for staff to accompany them.  L&D called for direct admit.

## 2018-04-15 NOTE — H&P (Signed)
LABOR AND DELIVERY ADMISSION HISTORY AND PHYSICAL NOTE  Shelia Brown is a 27 y.o. female G2P1001 with IUP at [redacted]w[redacted]d by LMP c/w  7-wk U/S presenting via EMS for advanced labo. She was seen in the office and found to be 9 cm. She reports positive fetal movement. She denies leakage of fluid or vaginal bleeding.  Prenatal History/Complications: PNC at [facility] starting at 10 weeks  Past Medical History: Past Medical History:  Diagnosis Date  . Contraceptive management 01/06/2015  . History of chlamydia 01/06/2015  . Irregular intermenstrual bleeding 03/31/2015  . Nexplanon removal 01/06/2015    Past Surgical History: Past Surgical History:  Procedure Laterality Date  . WISDOM TOOTH EXTRACTION      Obstetrical History: OB History    Gravida  2   Para  1   Term  1   Preterm      AB      Living  1     SAB      TAB      Ectopic      Multiple      Live Births  1           Social History: Social History   Socioeconomic History  . Marital status: Single    Spouse name: Not on file  . Number of children: Not on file  . Years of education: Not on file  . Highest education level: Not on file  Occupational History  . Not on file  Social Needs  . Financial resource strain: Not on file  . Food insecurity:    Worry: Not on file    Inability: Not on file  . Transportation needs:    Medical: Not on file    Non-medical: Not on file  Tobacco Use  . Smoking status: Former Smoker    Types: Cigars  . Smokeless tobacco: Never Used  Substance and Sexual Activity  . Alcohol use: No    Frequency: Never    Comment: occ before pregnancy  . Drug use: No    Types: Marijuana    Comment: stopped when pregnant  . Sexual activity: Yes    Birth control/protection: None  Lifestyle  . Physical activity:    Days per week: Not on file    Minutes per session: Not on file  . Stress: Not on file  Relationships  . Social connections:    Talks on phone: Not on file   Gets together: Not on file    Attends religious service: Not on file    Active member of club or organization: Not on file    Attends meetings of clubs or organizations: Not on file    Relationship status: Not on file  Other Topics Concern  . Not on file  Social History Narrative  . Not on file    Family History: Family History  Problem Relation Age of Onset  . Asthma Mother   . Cancer Maternal Grandfather        throat  . Seizures Brother   . Diabetes Maternal Grandmother   . Diabetes Maternal Aunt   . Alcohol abuse Maternal Aunt   . Hypertension Maternal Aunt   . Anuerysm Maternal Aunt   . Diabetes Maternal Aunt   . Diabetes Maternal Uncle   . Autism Other     Allergies: No Known Allergies  Medications Prior to Admission  Medication Sig Dispense Refill Last Dose  . Prenat-FeCbn-FeAspGl-FA-Omega (OB COMPLETE PETITE) 35-5-1-200 MG CAPS Take 1 daily  30 capsule 12 Taking     Review of Systems  All systems reviewed and negative except as stated in HPI  Physical Exam Height 5\' 6"  (1.676 m), weight 64 kg (141 lb), last menstrual period 07/13/2017. General appearance: alert, oriented Lungs: normal respiratory effort Heart: regular rate Abdomen: soft, non-tender; gravid, FH appropriate for GA Extremities: No calf swelling or tenderness Presentation: cephalic  Dilation: 10 Effacement (%): 100 Station: Plus 2 Exam by:: Dotti Busey  Prenatal labs: ABO, Rh: O/Positive/-- (11/14 1107) Antibody: Negative (03/11 0840) Rubella: 1.56 (11/14 1107) RPR: Non Reactive (03/11 0840)  HBsAg: Negative (11/14 1107)  HIV: Non Reactive (03/11 0840)  GC/Chlamydia: negative GBS: Negative (05/21 1545)  2-hr GTT: negative Genetic screening:  declined Anatomy US: normal  Prenatal Transfer Tool  Maternal Diabetes: No Genetic Screening: Declined Maternal Ultrasounds/Referrals: Normal Fetal Ultrasounds or other Referrals:  None Maternal Substance Abuse:  No Significant Maternal  Medications:  None Significant Maternal Lab Results: None  No results found for this or any previous visit (from the past 24 hour(s)).  Patient Active Problem List   Diagnosis Date Noted  . Indication for care in labor or delivery 04/15/2018  . Supervision of normal pregnancy 09/25/2017  . 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 Nausea and vomiting during pregnancy prior to [redacted] weeks gestation 08/26/2017  . History of chlamydia 01/06/2015    Assessment: Shelia Brown is a 27 y.o. G2P1001 at 1275w3d here for advanced labor, now s/p SVD shortly after arrival. See delivery noted.  --Routine PP care --Planing to bottle feed --Planning for OCPs for pp contraception  Kandra NicolasJulie P Eithan Beagle 04/15/2018, 11:31 AM

## 2018-04-15 NOTE — Telephone Encounter (Signed)
Patient called stating she has been contracting since 8 am with contractions 3-5 minutes a part with difficulty walking when they occur.  She has not noticed any bleeding or leaking but has been vomiting.  Informed patient she could be worked in for labor check but if she wanted to try taking a shower or warm bath prior, that would be fine.  Patient states she has been in the floor because she can't lay in her bed d/t the pain.  Advised patient to come in when she is able. States she lives 20 minutes away and could be here by 10am.

## 2018-04-16 LAB — RPR: RPR Ser Ql: NONREACTIVE

## 2018-04-16 MED ORDER — IBUPROFEN 600 MG PO TABS: 600.0000 mg | ORAL_TABLET | Freq: Four times a day (QID) | ORAL | 0 refills | Status: DC | PRN

## 2018-04-16 NOTE — Discharge Summary (Signed)
OB Discharge Summary     Patient Name: Shelia CockerBeate A Kaufmann DOB: 12/11/1990 MRN: [id number]  Date of admission: 04/15/2018 Delivering MD: Frederik PearEGELE, JULIE P   Date of discharge: 04/16/2018  Admitting diagnosis: LABOR Intrauterine pregnancy: [id number]     Secondary diagnosis:  Active Problems:   Indication for care in labor or delivery  Additional problems: precipitous labor     Discharge diagnosis: Term Pregnancy Delivered                                                                                                Post partum procedures:none  Augmentation: none  Complications: None  Hospital course:  Onset of Labor With Vaginal Delivery     27 y.o. yo [id number] at 3862w3d was admitted in Active Labor on 04/15/2018. Patient had an uncomplicated labor course as follows:  Membrane Rupture Time/Date: 11:06 AM ,04/15/2018   Intrapartum Procedures: Episiotomy: None [1]                                         Lacerations:  1st degree [2]  Patient had a delivery of a Viable infant. 04/15/2018  Information for the patient's newborn:  Casandra DoffingRucker, Girl Isidra [[id number]][[id number]]  Delivery Method: Vaginal, Spontaneous(Filed from Delivery Summary)    Pateint had an uncomplicated postpartum course.  She is ambulating, tolerating a regular diet, passing flatus, and urinating well. Patient is discharged home in stable condition on 04/16/18.   Physical exam  Vitals:   04/15/18 1523 04/15/18 1920 04/16/18 0406 04/16/18 0519  BP: 109/68 114/77 109/70 120/89  Pulse: 71 63 67 60  Resp: 20 18 16 16   Temp: 98.4 F (36.9 C) 98.6 F (37 C) 98.4 F (36.9 C) 98.5 F (36.9 C)  TempSrc: Oral Oral Oral Oral  Weight:      Height:       General: alert, cooperative and no distress Lochia: appropriate Uterine Fundus: firm Incision: N/A DVT Evaluation: No evidence of DVT seen on physical exam. Negative Homan's sign. No cords or calf tenderness. No significant calf/ankle edema. Labs: Lab Results  Component Value Date   WBC 21.6 (H) 04/15/2018   HGB 14.4 04/15/2018   HCT 41.1 04/15/2018   MCV 88.6 04/15/2018   PLT 163 04/15/2018   No flowsheet data found.  Discharge instruction: per After Visit Summary and "Baby and Me Booklet".  After visit meds:  Allergies as of 04/16/2018   No Known Allergies     Medication List    STOP taking these medications   OB COMPLETE PETITE 35-5-1-200 MG Caps     TAKE these medications   ibuprofen 600 MG tablet Commonly known as:  ADVIL,MOTRIN Take 1 tablet (600 mg total) by mouth every 6 (six) hours as needed for mild pain, moderate pain or cramping.       Diet: routine diet  Activity: Advance as tolerated. Pelvic rest for 6 weeks.   Outpatient follow up:6 weeks Follow up Appt: Future Appointments  Date Time Provider Department Center  04/17/2018 11:45 AM Eure, Amaryllis Dyke, MD [facility]   Follow up Visit:No follow-ups on file.  Postpartum contraception: Combination OCPs  Newborn Data: Live born female  Birth Weight: 7 lb 7.9 oz (3400 g) APGAR: 9, 9  Newborn Delivery   Birth date/time:  04/15/2018 11:14:00 Delivery type:  Vaginal, Spontaneous     Baby Feeding: Bottle Disposition:home with mother   04/16/2018 Cheral Marker, CNM

## 2018-04-16 NOTE — Discharge Instructions (Signed)
NO SEX UNTIL AFTER YOU GET YOUR BIRTH CONTROL  ° °Postpartum Care After Vaginal Delivery °The period of time right after you deliver your newborn is called the postpartum period. °What kind of medical care will I receive? °· You may continue to receive fluids and medicines through an IV tube inserted into one of your veins. °· If an incision was made near your vagina (episiotomy) or if you had some vaginal tearing during delivery, cold compresses may be placed on your episiotomy or your tear. This helps to reduce pain and swelling. °· You may be given a squirt bottle to use when you go to the bathroom. You may use this until you are comfortable wiping as usual. To use the squirt bottle, follow these steps: °? Before you urinate, fill the squirt bottle with warm water. Do not use hot water. °? After you urinate, while you are sitting on the toilet, use the squirt bottle to rinse the area around your urethra and vaginal opening. This rinses away any urine and blood. °? You may do this instead of wiping. As you start healing, you may use the squirt bottle before wiping yourself. Make sure to wipe gently. °? Fill the squirt bottle with clean water every time you use the bathroom. °· You will be given sanitary pads to wear. °How can I expect to feel? °· You may not feel the need to urinate for several hours after delivery. °· You will have some soreness and pain in your abdomen and vagina. °· If you are breastfeeding, you may have uterine contractions every time you breastfeed for up to several weeks postpartum. Uterine contractions help your uterus return to its normal size. °· It is normal to have vaginal bleeding (lochia) after delivery. The amount and appearance of lochia is often similar to a menstrual period in the first week after delivery. It will gradually decrease over the next few weeks to a dry, yellow-brown discharge. For most women, lochia stops completely by 6-8 weeks after delivery. Vaginal bleeding can  vary from woman to woman. °· Within the first few days after delivery, you may have breast engorgement. This is when your breasts feel heavy, full, and uncomfortable. Your breasts may also throb and feel hard, tightly stretched, warm, and tender. After this occurs, you may have milk leaking from your breasts. Your health care provider can help you relieve discomfort due to breast engorgement. Breast engorgement should go away within a few days. °· You may feel more sad or worried than normal due to hormonal changes after delivery. These feelings should not last more than a few days. If these feelings do not go away after several days, speak with your health care provider. °How should I care for myself? °· Tell your health care provider if you have pain or discomfort. °· Drink enough water to keep your urine clear or pale yellow. °· Wash your hands thoroughly with soap and water for at least 20 seconds after changing your sanitary pads, after using the toilet, and before holding or feeding your baby. °· If you are not breastfeeding, avoid touching your breasts a lot. Doing this can make your breasts produce more milk. °· If you become weak or lightheaded, or you feel like you might faint, ask for help before: °? Getting out of bed. °? Showering. °· Change your sanitary pads frequently. Watch for any changes in your flow, such as a sudden increase in volume, a change in color, the passing of large blood clots.   clots. If you pass a blood clot from your vagina, save it to show to your health care provider. Do not flush blood clots down the toilet without having your health care provider look at them. °· Make sure that all your vaccinations are up to date. This can help protect you and your baby from getting certain diseases. You may need to have immunizations done before you leave the hospital. °· If desired, talk with your health care provider about methods of family planning or birth control (contraception). °How can I start  bonding with my baby? °Spending as much time as possible with your baby is very important. During this time, you and your baby can get to know each other and develop a bond. Having your baby stay with you in your room (rooming in) can give you time to get to know your baby. Rooming in can also help you become comfortable caring for your baby. Breastfeeding can also help you bond with your baby. °How can I plan for returning home with my baby? °· Make sure that you have a car seat installed in your vehicle. °? Your car seat should be checked by a certified car seat installer to make sure that it is installed safely. °? Make sure that your baby fits into the car seat safely. °· Ask your health care provider any questions you have about caring for yourself or your baby. Make sure that you are able to contact your health care provider with any questions after leaving the hospital. °This information is not intended to replace advice given to you by your health care provider. Make sure you discuss any questions you have with your health care provider. °Document Released: 08/26/2007 Document Revised: 04/02/2016 Document Reviewed: 10/03/2015 °Elsevier Interactive Patient Education © 2018 Elsevier Inc. ° °

## 2018-04-17 ENCOUNTER — Encounter: Payer: BLUE CROSS/BLUE SHIELD | Admitting: Obstetrics & Gynecology

## 2018-05-21 ENCOUNTER — Encounter: Payer: Self-pay | Admitting: Advanced Practice Midwife

## 2018-05-21 ENCOUNTER — Ambulatory Visit (INDEPENDENT_AMBULATORY_CARE_PROVIDER_SITE_OTHER): Payer: BLUE CROSS/BLUE SHIELD | Admitting: Advanced Practice Midwife

## 2018-05-21 ENCOUNTER — Ambulatory Visit (INDEPENDENT_AMBULATORY_CARE_PROVIDER_SITE_OTHER): Payer: BLUE CROSS/BLUE SHIELD | Admitting: *Deleted

## 2018-05-21 VITALS — BP 108/63 | HR 50 | Ht 66.0 in | Wt 122.0 lb

## 2018-05-21 DIAGNOSIS — Z3202 Encounter for pregnancy test, result negative: Secondary | ICD-10-CM

## 2018-05-21 DIAGNOSIS — Z3042 Encounter for surveillance of injectable contraceptive: Secondary | ICD-10-CM

## 2018-05-21 LAB — POCT URINE PREGNANCY: Preg Test, Ur: NEGATIVE

## 2018-05-21 MED ORDER — MEDROXYPROGESTERONE ACETATE 150 MG/ML IM SUSP
150.0000 mg | Freq: Once | INTRAMUSCULAR | Status: AC
  Administered 2018-05-21: 150 mg via INTRAMUSCULAR

## 2018-05-21 MED ORDER — MEDROXYPROGESTERONE ACETATE 150 MG/ML IM SUSP: 150.0000 mg | INTRAMUSCULAR | 3 refills | Status: DC

## 2018-05-21 NOTE — Progress Notes (Signed)
Shelia Brown is a 27 y.o. who presents for a postpartum visit. She is 4 weeks postpartum following a spontaneous vaginal delivery. I have fully reviewed the prenatal and intrapartum course. The delivery was at 39.3 gestational weeks.  Anesthesia: none. Postpartum course has been uneventful. Baby's course has been uneventful. Baby is feeding by bottle. Bleeding: started period 2 days ago. Bowel function is normal. Bladder function is normal. Patient is not sexually active. Contraception method is Depo-Provera injections. Postpartum depression screening: negative.   Current Outpatient Medications:  .  medroxyPROGESTERone (DEPO-PROVERA) 150 MG/ML injection, Inject 1 mL (150 mg total) into the muscle every 3 (three) months., Disp: 1 mL, Rfl: 3  Review of Systems   Constitutional: Negative for fever and chills Eyes: Negative for visual disturbances Respiratory: Negative for shortness of breath, dyspnea Cardiovascular: Negative for chest pain or palpitations  Gastrointestinal: Negative for vomiting, diarrhea and constipation Genitourinary: Negative for dysuria and urgency Musculoskeletal: Negative for back pain, joint pain, myalgias  Neurological: Negative for dizziness and headaches    Objective:     Vitals:   05/21/18 1138  BP: 108/63  Pulse: (!) 50   General:  alert, cooperative and no distress   Breasts:  negative  Lungs: Normal respiratory effort  Heart:  regular rate and rhythm  Abdomen: Soft, nontender   Vulva:  normal  Vagina: normal vagina  Cervix:  closed  Corpus: Well involuted     Rectal Exam: no hemorrhoids        Assessment:    normal postpartum exam.  Plan:   1. Contraception: Depo-Provera injections 2. Follow up in:  Come back later today for depo or as needed.

## 2018-05-21 NOTE — Progress Notes (Signed)
Pt given DepoProvera 150mg IM left deltoid without complications. Advised to return in 12 weeks for next injection. 

## 2018-05-29 ENCOUNTER — Encounter: Payer: Self-pay | Admitting: Advanced Practice Midwife

## 2018-06-02 ENCOUNTER — Telehealth: Payer: Self-pay | Admitting: Advanced Practice Midwife

## 2018-06-02 MED ORDER — MEGESTROL ACETATE 40 MG PO TABS: ORAL_TABLET | ORAL | 1 refills | Status: DC

## 2018-06-02 NOTE — Telephone Encounter (Signed)
Patient called stating that she would like for a provider to call her in some medication to stop her period. Pt states that she got her depo 7/10 and she has ben on her period for 2 weeks. Pt uses [facility] on [address] in [place]. Please contact pt when done

## 2018-06-02 NOTE — Telephone Encounter (Signed)
Pt informed Megace was sent to pharmacy.  

## 2018-06-21 ENCOUNTER — Other Ambulatory Visit: Payer: Self-pay

## 2018-06-21 ENCOUNTER — Inpatient Hospital Stay (HOSPITAL_COMMUNITY)
Admission: AD | Admit: 2018-06-21 | Discharge: 2018-06-21 | Disposition: A | Discharge status: Home / Self Care | Payer: BLUE CROSS/BLUE SHIELD | Attending: Family Medicine | Admitting: Family Medicine

## 2018-06-21 ENCOUNTER — Encounter (HOSPITAL_COMMUNITY): Payer: Self-pay

## 2018-06-21 DIAGNOSIS — N938 Other specified abnormal uterine and vaginal bleeding: Secondary | ICD-10-CM | POA: Diagnosis not present

## 2018-06-21 DIAGNOSIS — Z87891 Personal history of nicotine dependence: Secondary | ICD-10-CM | POA: Diagnosis not present

## 2018-06-21 LAB — URINALYSIS, ROUTINE W REFLEX MICROSCOPIC
Bilirubin Urine: NEGATIVE
Glucose, UA: NEGATIVE mg/dL
Ketones, ur: NEGATIVE mg/dL
NITRITE: NEGATIVE
PH: 6 (ref 5.0–8.0)
Protein, ur: NEGATIVE mg/dL
RBC / HPF: >50 RBC/hpf — ABNORMAL HIGH (ref 0–5)
SPECIFIC GRAVITY, URINE: 1.028 (ref 1.005–1.030)

## 2018-06-21 LAB — POCT PREGNANCY, URINE: PREG TEST UR: NEGATIVE

## 2018-06-21 MED ORDER — ESTRADIOL 1 MG PO TABS: 1.0000 mg | ORAL_TABLET | Freq: Every day | ORAL | 1 refills | Status: DC

## 2018-06-21 NOTE — MAU Provider Note (Signed)
Patient Shelia Brown is a 27 y.o. [phone] At Unknown here with complaints of dysfunctional uterine bleeding that started in July after she got her Depo shot.  She delivered her baby on April 15, 2018. She received Depo on July 10th, but has continued to have irregular bleeding since her shot. She was started on Megace on July 22. She is now taking the Megace 40 mg once day.   She has used Depo-provera before but has not had this problem with her Depo before (this was in 2017).  History     CSN: [id number]  Arrival date and time: 06/21/18 9518   First Provider Initiated Contact with Patient 06/21/18 1233      Chief Complaint  Patient presents with  . Vaginal Bleeding   Vaginal Bleeding  The patient's primary symptoms include vaginal bleeding. This is a new problem. The current episode started 1 to 4 weeks ago. The problem occurs intermittently. The problem has been unchanged. The patient is experiencing no pain. Pertinent negatives include no constipation or diarrhea. The vaginal discharge was bloody. The vaginal bleeding is spotting. She has not been passing clots. She has not been passing tissue.    OB History    Gravida  2   Para  2   Term  2   Preterm  0   AB  0   Living  2     SAB  0   TAB  0   Ectopic  0   Multiple  0   Live Births  2           Past Medical History:  Diagnosis Date  . Contraceptive management 01/06/2015  . History of chlamydia 01/06/2015  . Irregular intermenstrual bleeding 03/31/2015  . Nexplanon removal 01/06/2015    Past Surgical History:  Procedure Laterality Date  . WISDOM TOOTH EXTRACTION      Family History  Problem Relation Age of Onset  . Asthma Mother   . Cancer Maternal Grandfather        throat  . Seizures Brother   . Diabetes Maternal Grandmother   . Diabetes Maternal Aunt   . Alcohol abuse Maternal Aunt   . Hypertension Maternal Aunt   . Anuerysm Maternal Aunt   . Diabetes Maternal Aunt   . Diabetes Maternal  Uncle   . Autism Other     Social History   Tobacco Use  . Smoking status: Former Smoker    Types: Cigars  . Smokeless tobacco: Never Used  Substance Use Topics  . Alcohol use: No    Frequency: Never    Comment: occ before pregnancy  . Drug use: No    Types: Marijuana    Comment: stopped when pregnant    Allergies: No Known Allergies  Medications Prior to Admission  Medication Sig Dispense Refill Last Dose  . medroxyPROGESTERone (DEPO-PROVERA) 150 MG/ML injection Inject 1 mL (150 mg total) into the muscle every 3 (three) months. 1 mL 3   . megestrol (MEGACE) 40 MG tablet 3x5d, 2x5d, then 1 daily to help control vaginal bleeding. Stop taking when bleeding stops. 45 tablet 1     Review of Systems  Constitutional: Negative.   HENT: Negative.   Respiratory: Negative.   Cardiovascular: Negative.   Gastrointestinal: Negative.  Negative for constipation and diarrhea.  Genitourinary: Positive for vaginal bleeding.  Musculoskeletal: Negative.   Neurological: Negative.   Hematological: Negative.   Psychiatric/Behavioral: Negative.    Physical Exam   Blood pressure 114/87, pulse  70, temperature 98.4 F (36.9 C), temperature source Oral, resp. rate 18, height 5\' 6"  (1.676 m), weight 56.2 kg, not currently breastfeeding.  Physical Exam  Constitutional: She is oriented to person, place, and time. She appears well-developed.  HENT:  Head: Normocephalic.  Neck: Normal range of motion.  Respiratory: Effort normal.  Musculoskeletal: Normal range of motion.  Neurological: She is alert and oriented to person, place, and time.  Skin: Skin is warm and dry.    MAU Course  Procedures  MDM -Bleeding is light; no speculum exam done, no labs or imaging as patient is asymptomatic and has no complaints of SOB, dizziness and bleeding is not concerning.  -Will try Estradiol 10 days  for bleeding  Assessment and Plan   1. Uterine bleeding, dysfunctional    2. Will try estrogen  therapy to stabilize uterine lining; try for 10 days. Make follow-up appt in 2 weeks to discuss uterine bleeding at [facility].   3. Patient stable for discharge; return to MAU if condition worsens or changes.    Charlesetta GaribaldiKathryn Lorraine Trudee Chirino 06/21/2018, 12:34 PM

## 2018-06-21 NOTE — Discharge Instructions (Signed)
Dysfunctional Uterine Bleeding °Dysfunctional uterine bleeding is abnormal bleeding from the uterus. Dysfunctional uterine bleeding includes: °· A period that comes earlier or later than usual. °· A period that is lighter, heavier, or has blood clots. °· Bleeding between periods. °· Skipping one or more periods. °· Bleeding after sexual intercourse. °· Bleeding after menopause. ° °Follow these instructions at home: °Pay attention to any changes in your symptoms. Follow these instructions to help with your condition: °Eating and drinking °· Eat well-balanced meals. Include foods that are high in iron, such as liver, meat, shellfish, green leafy vegetables, and eggs. °· If you become constipated: °? Drink plenty of water. °? Eat fruits and vegetables that are high in water and fiber, such as spinach, carrots, raspberries, apples, and mango. °Medicines °· Take over-the-counter and prescription medicines only as told by your health care provider. °· Do not change medicines without talking with your health care provider. °· Aspirin or medicines that contain aspirin may make the bleeding worse. Do not take those medicines: °? During the week before your period. °? During your period. °· If you were prescribed iron pills, take them as told by your health care provider. Iron pills help to replace iron that your body loses because of this condition. °Activity °· If you need to change your sanitary pad or tampon more than one time every 2 hours: °? Lie in bed with your feet raised (elevated). °? Place a cold pack on your lower abdomen. °? Rest as much as possible until the bleeding stops or slows down. °· Do not try to lose weight until the bleeding has stopped and your blood iron level is back to normal. °Other Instructions °· For two months, write down: °? When your period starts. °? When your period ends. °? When any abnormal bleeding occurs. °? What problems you notice. °· Keep all follow up visits as told by your health  care provider. This is important. °Contact a health care provider if: °· You get light-headed or weak. °· You have nausea and vomiting. °· You cannot eat or drink without vomiting. °· You feel dizzy or have diarrhea while you are taking medicines. °· You are taking birth control pills or hormones, and you want to change them or stop taking them. °Get help right away if: °· You develop a fever or chills. °· You need to change your sanitary pad or tampon more than one time per hour. °· Your bleeding becomes heavier, or your flow contains clots more often. °· You develop pain in your abdomen. °· You lose consciousness. °· You develop a rash. °This information is not intended to replace advice given to you by your health care provider. Make sure you discuss any questions you have with your health care provider. °Document Released: 10/26/2000 Document Revised: 04/05/2016 Document Reviewed: 01/24/2015 °Elsevier Interactive Patient Education © 2018 Elsevier Inc. ° °

## 2018-06-21 NOTE — Progress Notes (Addendum)
G2 PP who had ad SVD 04/15/2018. Presents to triage for cont. Bleeding since depo placed in July. Was put on Megace but pt still cont to bleed. Scant amount   OB Dr. Joanie CoddingtonNorton, Viviann SpareSteven in [place]   BP 114/87 (BP Location: Right Arm)   Pulse 70   Temp 98.4 F (36.9 C) (Oral)   Resp 18   Ht 5\' 6"  (1.676 m)   Wt 56.2 kg   BMI 20.01 kg/m    1210: pt called out. Provider made aware of pt status and needing to get to work at 1400. Pt made aware that provider notified of the above.  1314: pt d/c'd by Sabino GasserN Nicole. Charges done.

## 2018-06-21 NOTE — MAU Note (Signed)
SVD 06/04, started depo in July  Bleeding since July 8, has tried megace but it didn't work  Mostly spotting, no pain

## 2018-08-13 ENCOUNTER — Ambulatory Visit (INDEPENDENT_AMBULATORY_CARE_PROVIDER_SITE_OTHER): Payer: Medicaid Other

## 2018-08-13 VITALS — Ht 66.0 in | Wt 125.0 lb

## 2018-08-13 DIAGNOSIS — Z3202 Encounter for pregnancy test, result negative: Secondary | ICD-10-CM | POA: Diagnosis not present

## 2018-08-13 DIAGNOSIS — Z3042 Encounter for surveillance of injectable contraceptive: Secondary | ICD-10-CM

## 2018-08-13 LAB — POCT URINE PREGNANCY: Preg Test, Ur: NEGATIVE

## 2018-08-13 MED ORDER — MEDROXYPROGESTERONE ACETATE 150 MG/ML IM SUSP
150.0000 mg | Freq: Once | INTRAMUSCULAR | Status: AC
  Administered 2018-08-13: 150 mg via INTRAMUSCULAR

## 2018-08-13 NOTE — Progress Notes (Signed)
Pt here for depo injection 150 mg IM given rt deltoid. Tolerated well. Return 12 weeks for next injection. Pad CMA 

## 2018-09-24 ENCOUNTER — Other Ambulatory Visit: Payer: Medicaid Other | Admitting: Women's Health

## 2018-10-20 ENCOUNTER — Other Ambulatory Visit: Payer: Medicaid Other | Admitting: Adult Health

## 2018-11-06 ENCOUNTER — Ambulatory Visit: Payer: Medicaid Other

## 2018-11-07 ENCOUNTER — Ambulatory Visit (INDEPENDENT_AMBULATORY_CARE_PROVIDER_SITE_OTHER): Payer: Medicaid Other | Admitting: Obstetrics and Gynecology

## 2018-11-07 ENCOUNTER — Encounter: Payer: Self-pay | Admitting: Obstetrics and Gynecology

## 2018-11-07 VITALS — BP 123/78 | HR 93 | Ht 66.0 in | Wt 129.2 lb

## 2018-11-07 DIAGNOSIS — Z308 Encounter for other contraceptive management: Secondary | ICD-10-CM

## 2018-11-07 DIAGNOSIS — Z3202 Encounter for pregnancy test, result negative: Secondary | ICD-10-CM

## 2018-11-07 DIAGNOSIS — Z309 Encounter for contraceptive management, unspecified: Secondary | ICD-10-CM | POA: Insufficient documentation

## 2018-11-07 LAB — POCT URINE PREGNANCY: Preg Test, Ur: NEGATIVE

## 2018-11-07 MED ORDER — MEDROXYPROGESTERONE ACETATE 150 MG/ML IM SUSP: 150.0000 mg | INTRAMUSCULAR | 0 refills | Status: DC

## 2018-11-07 NOTE — Patient Instructions (Signed)
Etonogestrel implant  What is this medicine?  ETONOGESTREL (et oh noe JES trel) is a contraceptive (birth control) device. It is used to prevent pregnancy. It can be used for up to 3 years.  This medicine may be used for other purposes; ask your health care provider or pharmacist if you have questions.  COMMON BRAND NAME(S): Implanon, Nexplanon  What should I tell my health care provider before I take this medicine?  They need to know if you have any of these conditions:  -abnormal vaginal bleeding  -blood vessel disease or blood clots  -breast, cervical, endometrial, ovarian, liver, or uterine cancer  -diabetes  -gallbladder disease  -heart disease or recent heart attack  -high blood pressure  -high cholesterol or triglycerides  -kidney disease  -liver disease  -migraine headaches  -seizures  -stroke  -tobacco smoker  -an unusual or allergic reaction to etonogestrel, anesthetics or antiseptics, other medicines, foods, dyes, or preservatives  -pregnant or trying to get pregnant  -breast-feeding  How should I use this medicine?  This device is inserted just under the skin on the inner side of your upper arm by a health care professional.  Talk to your pediatrician regarding the use of this medicine in children. Special care may be needed.  Overdosage: If you think you have taken too much of this medicine contact a poison control center or emergency room at once.  NOTE: This medicine is only for you. Do not share this medicine with others.  What if I miss a dose?  This does not apply.  What may interact with this medicine?  Do not take this medicine with any of the following medications:  -amprenavir  -fosamprenavir  This medicine may also interact with the following medications:  -acitretin  -aprepitant  -armodafinil  -bexarotene  -bosentan  -carbamazepine  -certain medicines for fungal infections like fluconazole, ketoconazole, itraconazole and voriconazole  -certain medicines to treat hepatitis, HIV or  AIDS  -cyclosporine  -felbamate  -griseofulvin  -lamotrigine  -modafinil  -oxcarbazepine  -phenobarbital  -phenytoin  -primidone  -rifabutin  -rifampin  -rifapentine  -St. John's wort  -topiramate  This list may not describe all possible interactions. Give your health care provider a list of all the medicines, herbs, non-prescription drugs, or dietary supplements you use. Also tell them if you smoke, drink alcohol, or use illegal drugs. Some items may interact with your medicine.  What should I watch for while using this medicine?  This product does not protect you against HIV infection (AIDS) or other sexually transmitted diseases.  You should be able to feel the implant by pressing your fingertips over the skin where it was inserted. Contact your doctor if you cannot feel the implant, and use a non-hormonal birth control method (such as condoms) until your doctor confirms that the implant is in place. Contact your doctor if you think that the implant may have broken or become bent while in your arm.  You will receive a user card from your health care provider after the implant is inserted. The card is a record of the location of the implant in your upper arm and when it should be removed. Keep this card with your health records.  What side effects may I notice from receiving this medicine?  Side effects that you should report to your doctor or health care professional as soon as possible:  -allergic reactions like skin rash, itching or hives, swelling of the face, lips, or tongue  -breast lumps, breast tissue   changes, or discharge  -breathing problems  -changes in emotions or moods  -if you feel that the implant may have broken or bent while in your arm  -high blood pressure  -pain, irritation, swelling, or bruising at the insertion site  -scar at site of insertion  -signs of infection at the insertion site such as fever, and skin redness, pain or discharge  -signs and symptoms of a blood clot such as breathing  problems; changes in vision; chest pain; severe, sudden headache; pain, swelling, warmth in the leg; trouble speaking; sudden numbness or weakness of the face, arm or leg  -signs and symptoms of liver injury like dark yellow or brown urine; general ill feeling or flu-like symptoms; light-colored stools; loss of appetite; nausea; right upper belly pain; unusually weak or tired; yellowing of the eyes or skin  -unusual vaginal bleeding, discharge  Side effects that usually do not require medical attention (report to your doctor or health care professional if they continue or are bothersome):  -acne  -breast pain or tenderness  -headache  -irregular menstrual bleeding  -nausea  This list may not describe all possible side effects. Call your doctor for medical advice about side effects. You may report side effects to FDA at 1-800-FDA-1088.  Where should I keep my medicine?  This drug is given in a hospital or clinic and will not be stored at home.  NOTE: This sheet is a summary. It may not cover all possible information. If you have questions about this medicine, talk to your doctor, pharmacist, or health care provider.   2019 Elsevier/Gold Standard (2017-09-17 14:11:42)

## 2018-11-07 NOTE — Progress Notes (Signed)
Ms Shelia Brown is here to discuss changing contraception. Currently on Depo Provera but spots daily. Would like to switch to Nexplanon. Has used in the past without problems Due Depo injection.  PE AF VSS Lungs clear Heart RRR Abd soft + BS  A/P Contraception management  Rx for Depo sent to Pharmacy. Will return next week for injection and schedule Nexplanon insertion. Pt instructed on reframing from IC for 2 weeks prior to insertion

## 2018-11-13 ENCOUNTER — Ambulatory Visit (INDEPENDENT_AMBULATORY_CARE_PROVIDER_SITE_OTHER): Payer: BLUE CROSS/BLUE SHIELD | Admitting: *Deleted

## 2018-11-13 DIAGNOSIS — Z3202 Encounter for pregnancy test, result negative: Secondary | ICD-10-CM

## 2018-11-13 DIAGNOSIS — Z3042 Encounter for surveillance of injectable contraceptive: Secondary | ICD-10-CM

## 2018-11-13 LAB — POCT URINE PREGNANCY: Preg Test, Ur: NEGATIVE

## 2018-11-13 MED ORDER — MEDROXYPROGESTERONE ACETATE 150 MG/ML IM SUSP
150.0000 mg | Freq: Once | INTRAMUSCULAR | Status: AC
  Administered 2018-11-13: 150 mg via INTRAMUSCULAR

## 2018-11-13 NOTE — Progress Notes (Signed)
Pt in for depo provera injection. Given im in left deltoid. Pt to return in 12 weeks for next injection.

## 2019-02-05 ENCOUNTER — Ambulatory Visit (INDEPENDENT_AMBULATORY_CARE_PROVIDER_SITE_OTHER): Payer: BLUE CROSS/BLUE SHIELD | Admitting: *Deleted

## 2019-02-05 ENCOUNTER — Other Ambulatory Visit: Payer: Self-pay

## 2019-02-05 DIAGNOSIS — Z3042 Encounter for surveillance of injectable contraceptive: Secondary | ICD-10-CM

## 2019-02-05 MED ORDER — MEDROXYPROGESTERONE ACETATE 150 MG/ML IM SUSP
150.0000 mg | Freq: Once | INTRAMUSCULAR | Status: AC
  Administered 2019-02-05: 150 mg via INTRAMUSCULAR

## 2019-02-05 NOTE — Progress Notes (Signed)
Pt given depo provera 150mg IM left deltoid without complications. Advised to return in 12 weeks for next injection 

## 2019-04-30 ENCOUNTER — Ambulatory Visit: Payer: 59

## 2019-05-04 ENCOUNTER — Other Ambulatory Visit: Payer: Self-pay

## 2019-05-04 ENCOUNTER — Ambulatory Visit (INDEPENDENT_AMBULATORY_CARE_PROVIDER_SITE_OTHER): Payer: BC Managed Care – PPO | Admitting: *Deleted

## 2019-05-04 DIAGNOSIS — Z3042 Encounter for surveillance of injectable contraceptive: Secondary | ICD-10-CM

## 2019-05-04 DIAGNOSIS — Z308 Encounter for other contraceptive management: Secondary | ICD-10-CM

## 2019-05-04 MED ORDER — MEDROXYPROGESTERONE ACETATE 150 MG/ML IM SUSP
150.0000 mg | Freq: Once | INTRAMUSCULAR | Status: AC
  Administered 2019-05-04: 150 mg via INTRAMUSCULAR

## 2019-05-04 MED ORDER — MEDROXYPROGESTERONE ACETATE 150 MG/ML IM SUSP: 150.0000 mg | INTRAMUSCULAR | 0 refills | Status: DC

## 2019-05-04 NOTE — Progress Notes (Signed)
Depo Provera 150mg IM given in left deltoid with no complications. Pt to return in 12 weeks for next injection.  

## 2019-07-27 ENCOUNTER — Ambulatory Visit: Payer: BC Managed Care – PPO

## 2019-10-05 ENCOUNTER — Ambulatory Visit (INDEPENDENT_AMBULATORY_CARE_PROVIDER_SITE_OTHER): Payer: BC Managed Care – PPO | Admitting: *Deleted

## 2019-10-05 ENCOUNTER — Other Ambulatory Visit: Payer: Self-pay

## 2019-10-05 DIAGNOSIS — Z3042 Encounter for surveillance of injectable contraceptive: Secondary | ICD-10-CM

## 2019-10-05 DIAGNOSIS — Z3202 Encounter for pregnancy test, result negative: Secondary | ICD-10-CM

## 2019-10-05 LAB — POCT URINE PREGNANCY
Preg Test, Ur: NEGATIVE
Preg Test, Ur: NEGATIVE

## 2019-10-05 MED ORDER — MEDROXYPROGESTERONE ACETATE 150 MG/ML IM SUSP
150.0000 mg | Freq: Once | INTRAMUSCULAR | Status: AC
  Administered 2019-10-05: 12:00:00 150 mg via INTRAMUSCULAR

## 2019-10-05 NOTE — Progress Notes (Signed)
   NURSE VISIT- INJECTION  SUBJECTIVE:  Shelia Brown is a 28 y.o. G59P2002 female here for a Depo Provera for contraception/period management. She is a GYN patient.   OBJECTIVE:  LMP 09/21/2019   Appears well, in no apparent distress  Injection administered in: Left deltoid  No orders of the defined types were placed in this encounter.   ASSESSMENT: GYN patient Depo Provera for contraception/period management PLAN: Follow-up: in 11-13 weeks for next Depo   Patient's last Depo Provera was in June and missed appointment for 08-17-23. Patient stated no sexual intercourse since Aug 17, 2023. FOB passed away. Urine pregnancy test is negative and patient is currently on menstrual cycle. Consulted with Wells Guiles, CNM and patient can have Depo Provera today.   Rolena Infante  10/05/2019 11:42 AM

## 2019-12-11 ENCOUNTER — Encounter: Payer: Self-pay | Admitting: Adult Health

## 2019-12-11 ENCOUNTER — Ambulatory Visit (INDEPENDENT_AMBULATORY_CARE_PROVIDER_SITE_OTHER): Payer: Medicaid Other | Admitting: Adult Health

## 2019-12-11 ENCOUNTER — Other Ambulatory Visit: Payer: Self-pay

## 2019-12-11 VITALS — BP 128/79 | HR 71 | Ht 66.0 in | Wt 116.0 lb

## 2019-12-11 DIAGNOSIS — N926 Irregular menstruation, unspecified: Secondary | ICD-10-CM | POA: Diagnosis not present

## 2019-12-11 MED ORDER — MEGESTROL ACETATE 40 MG PO TABS: ORAL_TABLET | ORAL | 1 refills | Status: DC

## 2019-12-11 NOTE — Progress Notes (Signed)
  Subjective:     Patient ID: Shelia Brown, female   DOB: Dec 23, 1990, 29 y.o.   MRN: [id number]  HPI Bryar is a 29 year old black female, single G2P2 in complaining of bleeding on depo, had last shot in November. PCP is Dr Sudie Bailey.   Review of Systems Bleeding on depo, has had before Denies any pain Reviewed past medical,surgical, social and family history. Reviewed medications and allergies.     Objective:   Physical Exam BP 128/79 (BP Location: Left Arm, Patient Position: Sitting, Cuff Size: Normal)   Pulse 71   Ht 5\' 6"  (1.676 m)   Wt 116 lb (52.6 kg)   Breastfeeding No   BMI 18.72 kg/m   Skin warm and dry.  Lungs: clear to ausculation bilaterally. Cardiovascular: regular rate and rhythm.  She declines STD testing today.   Assessment:     1. Irregular bleeding, is on depo Will rx megace Meds ordered this encounter  Medications  . megestrol (MEGACE) 40 MG tablet    Sig: Take 3 x 5 days for then 2 x 5 days then 1 daily    Dispense:  45 tablet    Refill:  1    Order Specific Question:   Supervising Provider    Answer:   [[id number]]      Plan:    Has appt 12/28/19 for next depo  Return in 4 weeks for pap and physical and ROS

## 2019-12-24 ENCOUNTER — Other Ambulatory Visit: Payer: Self-pay | Admitting: Obstetrics & Gynecology

## 2019-12-24 DIAGNOSIS — Z308 Encounter for other contraceptive management: Secondary | ICD-10-CM

## 2019-12-28 ENCOUNTER — Ambulatory Visit: Payer: BC Managed Care – PPO

## 2020-01-07 ENCOUNTER — Encounter: Payer: Self-pay | Admitting: Adult Health

## 2020-01-07 ENCOUNTER — Ambulatory Visit (INDEPENDENT_AMBULATORY_CARE_PROVIDER_SITE_OTHER): Payer: Medicaid Other | Admitting: Adult Health

## 2020-01-07 ENCOUNTER — Other Ambulatory Visit: Payer: Self-pay

## 2020-01-07 ENCOUNTER — Other Ambulatory Visit (HOSPITAL_COMMUNITY)
Admission: RE | Admit: 2020-01-07 | Discharge: 2020-01-07 | Disposition: A | Discharge status: Home / Self Care | Payer: Medicaid Other | Source: Ambulatory Visit | Attending: Adult Health | Admitting: Adult Health

## 2020-01-07 VITALS — BP 110/69 | HR 100 | Ht 66.0 in | Wt 112.0 lb

## 2020-01-07 DIAGNOSIS — Z3042 Encounter for surveillance of injectable contraceptive: Secondary | ICD-10-CM | POA: Diagnosis not present

## 2020-01-07 DIAGNOSIS — Z0000 Encounter for general adult medical examination without abnormal findings: Secondary | ICD-10-CM | POA: Diagnosis not present

## 2020-01-07 DIAGNOSIS — Z3202 Encounter for pregnancy test, result negative: Secondary | ICD-10-CM | POA: Insufficient documentation

## 2020-01-07 DIAGNOSIS — Z01419 Encounter for gynecological examination (general) (routine) without abnormal findings: Secondary | ICD-10-CM | POA: Insufficient documentation

## 2020-01-07 LAB — POCT URINE PREGNANCY: Preg Test, Ur: NEGATIVE

## 2020-01-07 MED ORDER — MEDROXYPROGESTERONE ACETATE 150 MG/ML IM SUSP
150.0000 mg | Freq: Once | INTRAMUSCULAR | Status: AC
  Administered 2020-01-07: 12:00:00 150 mg via INTRAMUSCULAR

## 2020-01-07 NOTE — Progress Notes (Signed)
Patient given Depo Provera 150mg  in right deltoid. Next injection due May 13- May 27.

## 2020-01-07 NOTE — Progress Notes (Signed)
Patient ID: Shelia Brown, female   DOB: 1991/02/05, 29 y.o.   MRN: [id number] History of Present Illness: Shelia Brown is a 29 year old black female, single, G2P2 in for a well woman gyn exam and pap, and missed depo Monday, got today. PCP is Dr Sudie Bailey.    Current Medications, Allergies, Past Medical History, Past Surgical History, Family History and Social History were reviewed in [employer] record.     Review of Systems:  Patient denies any headaches, hearing loss, fatigue, blurred vision, shortness of breath, chest pain, abdominal pain, problems with bowel movements, urination, or intercourse. No joint pain or mood swings. +spotting some this week   Physical Exam:BP 110/69 (BP Location: Left Arm, Patient Position: Sitting, Cuff Size: Normal)   Pulse 100   Ht 5\' 6"  (1.676 m)   Wt 112 lb (50.8 kg)   BMI 18.08 kg/m UPT negative Received depo today. General:  Well developed, well nourished, no acute distress Skin:  Warm and dry Neck:  Midline trachea, normal thyroid, good ROM, no lymphadenopathy Lungs; Clear to auscultation bilaterally Breast:  No dominant palpable mass, retraction, or nipple discharge, has bilateral nipple rods Cardiovascular: Regular rate and rhythm Abdomen:  Soft, non tender, no hepatosplenomegaly Pelvic:  External genitalia is normal in appearance, no lesions.  The vagina is normal in appearance, spotting pink. Urethra has no lesions or masses. The cervix is bulbous.Pap with GC/CHL and high risk HPV 16/18 genotyping performed.  Uterus is felt to be normal size, shape, and contour.  No adnexal masses or tenderness noted.Bladder is non tender, no masses felt. Extremities/musculoskeletal:  No swelling or varicosities noted, no clubbing or cyanosis Psych:  No mood changes, alert and cooperative,seems happy Fall risk is low PHQ 2 score is 0 Examination chaperoned by LPN  Impression and Plan: 1. Urine pregnancy test negative  2.  Encounter for gynecological examination with Papanicolaou smear of cervix Pap sent Physical in 1 year Pap in 3 if normal Labs with PCP  3. Encounter for surveillance of injectable contraceptive Depo today return in 12 weeks for next depo Has refills on depo

## 2020-01-12 LAB — CYTOLOGY - PAP
Chlamydia: NEGATIVE
Comment: NEGATIVE
Comment: NEGATIVE
Comment: NORMAL
Diagnosis: NEGATIVE
High risk HPV: NEGATIVE
Neisseria Gonorrhea: NEGATIVE

## 2020-01-13 ENCOUNTER — Telehealth: Payer: Self-pay | Admitting: Adult Health

## 2020-01-13 MED ORDER — METRONIDAZOLE 500 MG PO TABS: 500.0000 mg | ORAL_TABLET | Freq: Two times a day (BID) | ORAL | 0 refills | Status: DC

## 2020-01-13 NOTE — Telephone Encounter (Signed)
Pt aware that pap was negative for malignancy and HPV and GC/chlamydia but +Trich, will rx flagyl, and get PCO 3/15 at 2:30 with self swab, no sex and tell partner to get treated or I can treat will need, name, dob any allergies and drug store

## 2020-01-25 ENCOUNTER — Other Ambulatory Visit (INDEPENDENT_AMBULATORY_CARE_PROVIDER_SITE_OTHER): Payer: Medicaid Other

## 2020-01-25 ENCOUNTER — Other Ambulatory Visit: Payer: Self-pay

## 2020-01-25 ENCOUNTER — Other Ambulatory Visit (HOSPITAL_COMMUNITY)
Admission: RE | Admit: 2020-01-25 | Discharge: 2020-01-25 | Disposition: A | Discharge status: Home / Self Care | Payer: Medicaid Other | Source: Ambulatory Visit | Attending: Obstetrics & Gynecology | Admitting: Obstetrics & Gynecology

## 2020-01-25 VITALS — Ht 66.0 in | Wt 120.0 lb

## 2020-01-25 DIAGNOSIS — Z113 Encounter for screening for infections with a predominantly sexual mode of transmission: Secondary | ICD-10-CM | POA: Insufficient documentation

## 2020-01-25 NOTE — Progress Notes (Signed)
   NURSE VISIT- proof of cure  SUBJECTIVE:  Shelia Brown is a 29 y.o. [id number] GYN patientfemale here for a vaginal swab for proof of cure treated trichomonas. .  OBJECTIVE:  Ht 5\' 6"  (1.676 m)   Wt 120 lb (54.4 kg)   BMI 19.37 kg/m   Appears well, in no apparent distress  ASSESSMENT: Vaginal swab for proof of cure   PLAN: Self-collected vaginal probe for trichomonas   Treatment: to be determined once results are received Follow-up as needed if symptoms persist/worsen, or new symptoms develop   01/25/2020 2:36 PM

## 2020-01-27 ENCOUNTER — Other Ambulatory Visit: Payer: Self-pay | Admitting: Adult Health

## 2020-01-27 LAB — CERVICOVAGINAL ANCILLARY ONLY
Comment: NEGATIVE
Trichomonas: POSITIVE — AB

## 2020-01-27 MED ORDER — TINIDAZOLE 500 MG PO TABS: ORAL_TABLET | ORAL | 0 refills | Status: DC

## 2020-02-08 ENCOUNTER — Ambulatory Visit (INDEPENDENT_AMBULATORY_CARE_PROVIDER_SITE_OTHER): Payer: Medicaid Other | Admitting: Adult Health

## 2020-02-08 ENCOUNTER — Encounter: Payer: Self-pay | Admitting: Adult Health

## 2020-02-08 ENCOUNTER — Other Ambulatory Visit (HOSPITAL_COMMUNITY)
Admission: RE | Admit: 2020-02-08 | Discharge: 2020-02-08 | Disposition: A | Discharge status: Home / Self Care | Payer: Medicaid Other | Source: Ambulatory Visit | Attending: Adult Health | Admitting: Adult Health

## 2020-02-08 ENCOUNTER — Other Ambulatory Visit: Payer: Self-pay

## 2020-02-08 VITALS — BP 119/82 | HR 72 | Ht 66.0 in | Wt 118.5 lb

## 2020-02-08 DIAGNOSIS — Z8619 Personal history of other infectious and parasitic diseases: Secondary | ICD-10-CM | POA: Insufficient documentation

## 2020-02-08 NOTE — Progress Notes (Signed)
  Subjective:     Patient ID: Shelia Brown, female   DOB: 22-Feb-1991, 29 y.o.   MRN: [id number]  HPI Shelia Brown is a 29 year old black female,single, G2P2 in for proof of treatment for recent trich. PCP is Dr Sudie Bailey.   Review of Systems No bleeding now and no sex since treatment Reviewed past medical,surgical, social and family history. Reviewed medications and allergies.     Objective:   Physical Exam BP 119/82 (BP Location: Left Arm, Patient Position: Sitting, Cuff Size: Normal)   Pulse 72   Ht 5\' 6"  (1.676 m)   Wt 118 lb 8 oz (53.8 kg)   Breastfeeding No   BMI 19.13 kg/m  Skin warm and dry.Pelvic: external genitalia is normal in appearance no lesions, vagina: white discharge without odor,urethra has no lesions or masses noted, cervix:smooth and bulbous, uterus: normal size, shape and contour, non tender, no masses felt, adnexa: no masses or tenderness noted. Bladder is non tender and no masses felt. CV swab obtained.   Examination chaperoned by CMA. Alcohol audit is 2, fall risk is low low PHQ 2 score is 0.  Assessment:     1. History of trichomoniasis CV swab sent     Plan:     Follow up prn

## 2020-02-09 LAB — CERVICOVAGINAL ANCILLARY ONLY
Comment: NEGATIVE
Trichomonas: NEGATIVE

## 2020-03-31 ENCOUNTER — Ambulatory Visit (INDEPENDENT_AMBULATORY_CARE_PROVIDER_SITE_OTHER): Payer: Medicaid Other | Admitting: *Deleted

## 2020-03-31 ENCOUNTER — Other Ambulatory Visit: Payer: Self-pay

## 2020-03-31 DIAGNOSIS — Z3042 Encounter for surveillance of injectable contraceptive: Secondary | ICD-10-CM | POA: Diagnosis not present

## 2020-03-31 MED ORDER — MEDROXYPROGESTERONE ACETATE 150 MG/ML IM SUSP
150.0000 mg | Freq: Once | INTRAMUSCULAR | Status: AC
  Administered 2020-03-31: 150 mg via INTRAMUSCULAR

## 2020-03-31 NOTE — Progress Notes (Signed)
   NURSE VISIT- INJECTION  SUBJECTIVE:  Shelia Brown is a 29 y.o. G27P2002 female here for a Depo Provera for contraception/period management. She is a GYN patient.   OBJECTIVE:  There were no vitals taken for this visit.  Appears well, in no apparent distress  Injection administered in: Left deltoid  Meds ordered this encounter  Medications  . medroxyPROGESTERone (DEPO-PROVERA) injection 150 mg    ASSESSMENT: GYN patient Depo Provera for contraception/period management PLAN: Follow-up: in 11-13 weeks for next Depo   Stoney Bang  03/31/2020 9:47 AM

## 2020-04-04 ENCOUNTER — Other Ambulatory Visit: Payer: Self-pay | Admitting: Adult Health

## 2020-04-13 ENCOUNTER — Other Ambulatory Visit (HOSPITAL_COMMUNITY)
Admission: RE | Admit: 2020-04-13 | Discharge: 2020-04-13 | Disposition: A | Discharge status: Home / Self Care | Payer: Medicaid Other | Source: Ambulatory Visit | Attending: Obstetrics and Gynecology | Admitting: Obstetrics and Gynecology

## 2020-04-13 ENCOUNTER — Other Ambulatory Visit (INDEPENDENT_AMBULATORY_CARE_PROVIDER_SITE_OTHER): Payer: Medicaid Other | Admitting: *Deleted

## 2020-04-13 DIAGNOSIS — Z113 Encounter for screening for infections with a predominantly sexual mode of transmission: Secondary | ICD-10-CM | POA: Diagnosis not present

## 2020-04-13 NOTE — Progress Notes (Signed)
   NURSE VISIT-STD  SUBJECTIVE:  Shelia Brown is a 29 y.o. [id number] GYN patientfemale here for a vaginal swab for STD screen.  She reports the following symptoms: none. Denies abnormal vaginal bleeding, significant pelvic pain, fever, or UTI symptoms.  OBJECTIVE:  There were no vitals taken for this visit.  Appears well, in no apparent distress  ASSESSMENT: Vaginal swab for STD screen  PLAN: Self-collected vaginal probe for Gonorrhea, Chlamydia, Trichomonas sent to lab Treatment: to be determined once results are received Follow-up as needed if symptoms persist/worsen, or new symptoms develop  Annamarie Dawley  04/13/2020 10:56 AM

## 2020-04-14 LAB — CERVICOVAGINAL ANCILLARY ONLY
Chlamydia: NEGATIVE
Comment: NEGATIVE
Comment: NEGATIVE
Comment: NORMAL
Neisseria Gonorrhea: NEGATIVE
Trichomonas: NEGATIVE

## 2020-04-18 ENCOUNTER — Other Ambulatory Visit: Payer: Medicaid Other

## 2020-04-18 NOTE — Progress Notes (Signed)
Chart reviewed for nurse visit. Agree with plan of care.  Adline Potter, NP 04/18/2020 1:49 PM

## 2020-06-23 ENCOUNTER — Ambulatory Visit: Payer: Medicaid Other

## 2020-06-28 ENCOUNTER — Ambulatory Visit (INDEPENDENT_AMBULATORY_CARE_PROVIDER_SITE_OTHER): Payer: Medicaid Other | Admitting: *Deleted

## 2020-06-28 ENCOUNTER — Other Ambulatory Visit: Payer: Self-pay

## 2020-06-28 DIAGNOSIS — Z3042 Encounter for surveillance of injectable contraceptive: Secondary | ICD-10-CM | POA: Diagnosis not present

## 2020-06-28 MED ORDER — MEDROXYPROGESTERONE ACETATE 150 MG/ML IM SUSP
150.0000 mg | Freq: Once | INTRAMUSCULAR | Status: AC
  Administered 2020-06-28: 150 mg via INTRAMUSCULAR

## 2020-06-28 NOTE — Progress Notes (Signed)
° °  NURSE VISIT- INJECTION  SUBJECTIVE:  Shelia Brown is a 29 y.o. G61P2002 female here for a Depo Provera for contraception/period management. She is a GYN patient.   OBJECTIVE:  There were no vitals taken for this visit.  Appears well, in no apparent distress  Injection administered in: Right deltoid  Meds ordered this encounter  Medications   medroxyPROGESTERone (DEPO-PROVERA) injection 150 mg    ASSESSMENT: GYN patient Depo Provera for contraception/period management PLAN: Follow-up: in 11-13 weeks for next Depo   Annamarie Dawley  06/28/2020 10:53 AM

## 2020-09-14 ENCOUNTER — Other Ambulatory Visit (HOSPITAL_COMMUNITY)
Admission: RE | Admit: 2020-09-14 | Discharge: 2020-09-14 | Disposition: A | Discharge status: Home / Self Care | Payer: Medicaid Other | Source: Ambulatory Visit | Attending: Obstetrics & Gynecology | Admitting: Obstetrics & Gynecology

## 2020-09-14 ENCOUNTER — Other Ambulatory Visit (INDEPENDENT_AMBULATORY_CARE_PROVIDER_SITE_OTHER): Payer: Medicaid Other | Admitting: *Deleted

## 2020-09-14 DIAGNOSIS — N939 Abnormal uterine and vaginal bleeding, unspecified: Secondary | ICD-10-CM

## 2020-09-14 NOTE — Progress Notes (Addendum)
   NURSE VISIT- VAGINITIS/STD/POC  SUBJECTIVE:  Shelia Brown is a 29 y.o. [id number] GYN patientfemale here for a vaginal swab for STD screen.  She reports the following symptoms: abnormal bleeding: flow is moderate for 2 months. Denies abnormal vaginal bleeding, significant pelvic pain, fever, or UTI symptoms.  OBJECTIVE:  There were no vitals taken for this visit.  Appears well, in no apparent distress  ASSESSMENT: Vaginal swab for STD screen  PLAN: Self-collected vaginal probe for Gonorrhea, Chlamydia, Trichomonas sent to lab Treatment: to be determined once results are received Follow-up as needed if symptoms persist/worsen, or new symptoms develop  Annamarie Dawley  09/14/2020 2:35 PM    Chart reviewed for nurse visit. Agree with plan of care.  Arabella Merles, CNM 09/14/2020 3:18 PM

## 2020-09-16 LAB — CERVICOVAGINAL ANCILLARY ONLY
Chlamydia: NEGATIVE
Comment: NEGATIVE
Comment: NEGATIVE
Comment: NORMAL
Neisseria Gonorrhea: NEGATIVE
Trichomonas: NEGATIVE

## 2020-09-20 ENCOUNTER — Ambulatory Visit: Payer: Medicaid Other

## 2020-12-14 ENCOUNTER — Other Ambulatory Visit (INDEPENDENT_AMBULATORY_CARE_PROVIDER_SITE_OTHER): Payer: Medicaid Other

## 2020-12-14 ENCOUNTER — Other Ambulatory Visit (HOSPITAL_COMMUNITY)
Admission: RE | Admit: 2020-12-14 | Discharge: 2020-12-14 | Disposition: A | Discharge status: Home / Self Care | Payer: Medicaid Other | Source: Ambulatory Visit | Attending: Obstetrics & Gynecology | Admitting: Obstetrics & Gynecology

## 2020-12-14 ENCOUNTER — Other Ambulatory Visit: Payer: Self-pay

## 2020-12-14 DIAGNOSIS — N898 Other specified noninflammatory disorders of vagina: Secondary | ICD-10-CM | POA: Diagnosis not present

## 2020-12-14 NOTE — Progress Notes (Addendum)
   NURSE VISIT- VAGINITIS/STD/POC  SUBJECTIVE:  Shelia Brown is Shelia 30 y.o. [id number] GYN patientfemale here for Shelia vaginal swab for vaginitis screening, STD screen.  She reports the following symptoms: discharge described as white and curd-like for 3 days. Denies abnormal vaginal bleeding, significant pelvic pain, fever, or UTI symptoms.  OBJECTIVE:  There were no vitals taken for this visit.  Appears well, in no apparent distress  ASSESSMENT: Vaginal swab for vaginitis screening  PLAN: Self-collected vaginal probe for Gonorrhea, Chlamydia, Trichomonas, Bacterial Vaginosis, Yeast sent to lab Treatment: to be determined once results are received Follow-up as needed if symptoms persist/worsen, or new symptoms develop  Shelia Brown Shelia Brown  12/14/2020 10:15 AM   Chart reviewed for nurse visit. Agree with plan of care.  Shelia Brown, Shelia Brown 12/14/2020 1:55 PM

## 2020-12-15 LAB — CERVICOVAGINAL ANCILLARY ONLY
Bacterial Vaginitis (gardnerella): NEGATIVE
Candida Glabrata: NEGATIVE
Candida Vaginitis: NEGATIVE
Chlamydia: NEGATIVE
Comment: NEGATIVE
Comment: NEGATIVE
Comment: NEGATIVE
Comment: NEGATIVE
Comment: NEGATIVE
Comment: NORMAL
Neisseria Gonorrhea: NEGATIVE
Trichomonas: NEGATIVE

## 2021-01-18 ENCOUNTER — Other Ambulatory Visit: Payer: Medicaid Other | Admitting: Advanced Practice Midwife

## 2021-03-10 DIAGNOSIS — Z23 Encounter for immunization: Secondary | ICD-10-CM | POA: Diagnosis not present

## 2021-03-23 ENCOUNTER — Other Ambulatory Visit: Payer: Self-pay

## 2021-03-23 ENCOUNTER — Other Ambulatory Visit (INDEPENDENT_AMBULATORY_CARE_PROVIDER_SITE_OTHER): Payer: Medicaid Other

## 2021-03-23 ENCOUNTER — Other Ambulatory Visit (HOSPITAL_COMMUNITY)
Admission: RE | Admit: 2021-03-23 | Discharge: 2021-03-23 | Disposition: A | Discharge status: Home / Self Care | Payer: Medicaid Other | Source: Ambulatory Visit | Attending: Obstetrics & Gynecology | Admitting: Obstetrics & Gynecology

## 2021-03-23 DIAGNOSIS — N898 Other specified noninflammatory disorders of vagina: Secondary | ICD-10-CM | POA: Insufficient documentation

## 2021-03-23 NOTE — Progress Notes (Signed)
Chart reviewed for nurse visit. Agree with plan of care.  Adline Potter, NP 03/23/2021 12:44 PM

## 2021-03-23 NOTE — Progress Notes (Signed)
   NURSE VISIT- VAGINITIS/STD/POC  SUBJECTIVE:  Shelia Brown is a 30 y.o. [id number] GYN patientfemale here for a vaginal swab for vaginitis screening, STD screen.  She reports the following symptoms: discharge described as white, malodorous and vulvar erythema noted, local irritation, odor and vulvar itching for 5 days. Denies abnormal vaginal bleeding, significant pelvic pain, fever, or UTI symptoms.  OBJECTIVE:  There were no vitals taken for this visit.  Appears well, in no apparent distress  ASSESSMENT: Vaginal swab for vaginitis & STD screening  PLAN: Self-collected vaginal probe for Gonorrhea, Chlamydia, Trichomonas, Bacterial Vaginosis, Yeast sent to lab Treatment: to be determined once results are received Follow-up as needed if symptoms persist/worsen, or new symptoms develop  Fatin Bachicha A Tekla Malachowski  03/23/2021 11:59 AM

## 2021-03-24 ENCOUNTER — Other Ambulatory Visit: Payer: Self-pay | Admitting: Obstetrics & Gynecology

## 2021-03-24 LAB — CERVICOVAGINAL ANCILLARY ONLY
Bacterial Vaginitis (gardnerella): NEGATIVE
Candida Glabrata: NEGATIVE
Candida Vaginitis: POSITIVE — AB
Chlamydia: NEGATIVE
Comment: NEGATIVE
Comment: NEGATIVE
Comment: NEGATIVE
Comment: NEGATIVE
Comment: NEGATIVE
Comment: NORMAL
Neisseria Gonorrhea: NEGATIVE
Trichomonas: NEGATIVE

## 2021-03-24 MED ORDER — TERCONAZOLE 0.4 % VA CREA: 1.0000 | TOPICAL_CREAM | Freq: Every day | VAGINAL | 0 refills | Status: DC

## 2021-03-28 DIAGNOSIS — F419 Anxiety disorder, unspecified: Secondary | ICD-10-CM | POA: Diagnosis not present

## 2021-03-28 DIAGNOSIS — F4321 Adjustment disorder with depressed mood: Secondary | ICD-10-CM | POA: Diagnosis not present

## 2021-03-28 DIAGNOSIS — F4310 Post-traumatic stress disorder, unspecified: Secondary | ICD-10-CM | POA: Diagnosis not present

## 2021-05-12 DIAGNOSIS — Z23 Encounter for immunization: Secondary | ICD-10-CM | POA: Diagnosis not present

## 2021-05-18 ENCOUNTER — Other Ambulatory Visit: Payer: Self-pay

## 2021-05-18 ENCOUNTER — Ambulatory Visit (INDEPENDENT_AMBULATORY_CARE_PROVIDER_SITE_OTHER): Payer: Medicaid Other | Admitting: Adult Health

## 2021-05-18 ENCOUNTER — Encounter: Payer: Self-pay | Admitting: Adult Health

## 2021-05-18 ENCOUNTER — Other Ambulatory Visit (HOSPITAL_COMMUNITY)
Admission: RE | Admit: 2021-05-18 | Discharge: 2021-05-18 | Disposition: A | Discharge status: Home / Self Care | Payer: Medicaid Other | Source: Ambulatory Visit | Attending: Adult Health | Admitting: Adult Health

## 2021-05-18 VITALS — BP 128/85 | HR 70 | Ht 66.75 in | Wt 103.0 lb

## 2021-05-18 DIAGNOSIS — Z01419 Encounter for gynecological examination (general) (routine) without abnormal findings: Secondary | ICD-10-CM | POA: Diagnosis not present

## 2021-05-18 DIAGNOSIS — Z113 Encounter for screening for infections with a predominantly sexual mode of transmission: Secondary | ICD-10-CM | POA: Diagnosis not present

## 2021-05-18 NOTE — Progress Notes (Signed)
Patient ID: Shelia Brown, female   DOB: 1991-01-04, 30 y.o.   MRN: [id number] History of Present Illness: Shelia Brown is a 30 year old black female,single, G2P2 in for a well woman gyn exam. Lab Results  Component Value Date   DIAGPAP  01/07/2020    - Negative for intraepithelial lesion or malignancy (NILM)   HPVHIGH Negative 01/07/2020      Current Medications, Allergies, Past Medical History, Past Surgical History, Family History and Social History were reviewed in [employer] record.     Review of Systems:  Patient denies any headaches, hearing loss, fatigue, blurred vision, shortness of breath, chest pain, abdominal pain, problems with bowel movements, urination, or intercourse. (Not having sex,currently).No joint pain or mood swings.    Physical Exam:BP 128/85 (BP Location: Left Arm, Patient Position: Sitting, Cuff Size: Normal)   Pulse 70   Ht 5' 6.75" (1.695 m)   Wt 103 lb (46.7 kg)   LMP 04/21/2021 (Approximate)   BMI 16.25 kg/m   General:  Well developed, well nourished, no acute distress Skin:  Warm and dry Neck:  Midline trachea, normal thyroid, good ROM, no lymphadenopathy Lungs; Clear to auscultation bilaterally Breast:  No dominant palpable mass, retraction, or nipple discharge,has bilateral nipple rods Cardiovascular: Regular rate and rhythm Abdomen:  Soft, non tender, no hepatosplenomegaly Pelvic:  External genitalia is normal in appearance, no lesions.  The vagina is normal in appearance. Urethra has no lesions or masses. The cervix is bulbous.  Uterus is felt to be normal size, shape, and contour.  No adnexal masses or tenderness noted.Bladder is non tender, no masses felt. CV swab obtained Extremities/musculoskeletal:  No swelling or varicosities noted, no clubbing or cyanosis Psych:  No mood changes, alert and cooperative,seems happy AA is 2 Fall risk is low Depression screen [facility] 2/9 05/18/2021 02/08/2020 01/07/2020  Decreased Interest 0 0 0   Down, Depressed, Hopeless 0 0 0  PHQ - 2 Score 0 0 0  Altered sleeping 0 - -  Tired, decreased energy 0 - -  Change in appetite 0 - -  Feeling bad or failure about yourself  0 - -  Trouble concentrating 0 - -  Moving slowly or fidgety/restless 0 - -  Suicidal thoughts 0 - -  PHQ-9 Score 0 - -  Difficult doing work/chores - - -    GAD 7 : Generalized Anxiety Score 05/18/2021  Nervous, Anxious, on Edge 0  Control/stop worrying 0  Worry too much - different things 0  Trouble relaxing 0  Restless 0  Easily annoyed or irritable 0  Afraid - awful might happen 0  Total GAD 7 Score 0      Upstream - 05/18/21 0933       Pregnancy Intention Screening   Does the patient want to become pregnant in the next year? No    Does the patient's partner want to become pregnant in the next year? No    Would the patient like to discuss contraceptive options today? No      Contraception Wrap Up   Current Method Abstinence    End Method Abstinence    Contraception Counseling Provided Yes            Examination chaperoned by Malachy Mood LPN   Impression and Plan: 1. Screening examination for STD (sexually transmitted disease) CV swab sent for GC/CHL,trich,BV and yeast   2. Well woman exam with routine gynecological exam Physical in 1 year Pap in 2024

## 2021-05-19 LAB — CERVICOVAGINAL ANCILLARY ONLY
Bacterial Vaginitis (gardnerella): NEGATIVE
Candida Glabrata: NEGATIVE
Candida Vaginitis: NEGATIVE
Chlamydia: NEGATIVE
Comment: NEGATIVE
Comment: NEGATIVE
Comment: NEGATIVE
Comment: NEGATIVE
Comment: NEGATIVE
Comment: NORMAL
Neisseria Gonorrhea: NEGATIVE
Trichomonas: NEGATIVE

## 2021-12-25 ENCOUNTER — Telehealth: Payer: Self-pay | Admitting: Adult Health

## 2021-12-25 NOTE — Telephone Encounter (Signed)
Patient calling wanting to know since she had a pap 06/02/21 if she could still have a script for depo? If so will the script be sent to pharmacy to [facility] on [address] please

## 2021-12-26 ENCOUNTER — Telehealth: Payer: Self-pay | Admitting: Adult Health

## 2021-12-26 DIAGNOSIS — H6121 Impacted cerumen, right ear: Secondary | ICD-10-CM | POA: Diagnosis not present

## 2021-12-26 NOTE — Telephone Encounter (Signed)
Patient is coming tomorrow at 2:50 for a depo restart. She will need medicine sent to pharmacy for her to pick up.

## 2021-12-26 NOTE — Telephone Encounter (Signed)
Called pt for clarification, two identifiers used. Pt has not had a physical since 2/21 and not been on Depo for the past two years. Pt was transferred to front to make appt for provider visit.

## 2021-12-27 ENCOUNTER — Ambulatory Visit: Payer: Medicaid Other | Admitting: Adult Health

## 2021-12-27 ENCOUNTER — Encounter: Payer: Self-pay | Admitting: Adult Health

## 2021-12-27 ENCOUNTER — Other Ambulatory Visit: Payer: Self-pay

## 2021-12-27 VITALS — BP 125/71 | HR 87 | Ht 66.0 in | Wt 115.4 lb

## 2021-12-27 DIAGNOSIS — Z30013 Encounter for initial prescription of injectable contraceptive: Secondary | ICD-10-CM | POA: Diagnosis not present

## 2021-12-27 LAB — POCT URINE PREGNANCY: Preg Test, Ur: NEGATIVE

## 2021-12-27 MED ORDER — MEDROXYPROGESTERONE ACETATE 150 MG/ML IM SUSP: 150.0000 mg | INTRAMUSCULAR | 4 refills | Status: DC

## 2021-12-27 MED ORDER — MEDROXYPROGESTERONE ACETATE 150 MG/ML IM SUSY: PREFILLED_SYRINGE | Freq: Once | INTRAMUSCULAR | Status: AC

## 2021-12-27 NOTE — Progress Notes (Signed)
°  Subjective:     Patient ID: Shelia Brown, female   DOB: Nov 04, 1991, 31 y.o.   MRN: [id number]  HPI Shelia Brown is a 31 year old black female,single,G2P2 in to discuss getting back on depo, has not had sex since LPM 12/23/21.   Lab Results  Component Value Date   DIAGPAP  01/07/2020    - Negative for intraepithelial lesion or malignancy (NILM)   HPVHIGH Negative 01/07/2020   PCP is Dr Sudie Bailey.  Review of Systems Patient denies any headaches, hearing loss, fatigue, blurred vision, shortness of breath, chest pain, abdominal pain, problems with bowel movements, urination, or intercourse. No joint pain or mood swings.  Reviewed past medical,surgical, social and family history. Reviewed medications and allergies.     Objective:   Physical Exam BP 125/71 (BP Location: Right Arm, Patient Position: Sitting, Cuff Size: Normal)    Pulse 87    Ht 5\' 6"  (1.676 m)    Wt 115 lb 6.4 oz (52.3 kg)    LMP 12/23/2021 (Exact Date)    BMI 18.63 kg/m     UPT is negative.Skin warm and dry. Lungs: clear to ausculation bilaterally. Cardiovascular: regular rate and rhythm.   Upstream - 12/27/21 1512       Pregnancy Intention Screening   Does the patient want to become pregnant in the next year? No    Does the patient's partner want to become pregnant in the next year? No    Would the patient like to discuss contraceptive options today? Yes      Contraception Wrap Up   Current Method Female Condom    End Method Hormonal Injection;Female Condom    Contraception Counseling Provided Yes             Assessment:     1. Initiation of Depo Provera UPT is negative  First depo given in office today, and continue to use condoms   2. Encounter for initial prescription of injectable contraceptive Will rx depo Meds ordered this encounter  Medications   medroxyPROGESTERone (DEPO-PROVERA) 150 MG/ML injection    Sig: Inject 1 mL (150 mg total) into the muscle every 3 (three) months.    Dispense:  1 mL    Refill:   4    Order Specific Question:   Supervising Provider    Answer:   12/29/21 [[id number]]       Plan:     Return in 12 weeks for next depo

## 2022-01-17 ENCOUNTER — Other Ambulatory Visit (INDEPENDENT_AMBULATORY_CARE_PROVIDER_SITE_OTHER): Payer: Medicaid Other | Admitting: *Deleted

## 2022-01-17 ENCOUNTER — Other Ambulatory Visit (HOSPITAL_COMMUNITY)
Admission: RE | Admit: 2022-01-17 | Discharge: 2022-01-17 | Disposition: A | Discharge status: Home / Self Care | Payer: Medicaid Other | Source: Ambulatory Visit | Attending: Obstetrics & Gynecology | Admitting: Obstetrics & Gynecology

## 2022-01-17 ENCOUNTER — Other Ambulatory Visit: Payer: Self-pay

## 2022-01-17 DIAGNOSIS — Z113 Encounter for screening for infections with a predominantly sexual mode of transmission: Secondary | ICD-10-CM | POA: Diagnosis not present

## 2022-01-17 NOTE — Progress Notes (Signed)
? ?  NURSE VISIT- VAGINITIS/STD/POC ? ?SUBJECTIVE:  ?BLANKA ROCKHOLT is a 31 y.o. [id number] GYN patientfemale here for a vaginal swab for STD screen.  She reports the following symptoms: none for 0 days. ?Denies abnormal vaginal bleeding, significant pelvic pain, fever, or UTI symptoms. ? ?OBJECTIVE:  ?LMP 12/23/2021 (Exact Date)   ?Appears well, in no apparent distress ? ?ASSESSMENT: ?Vaginal swab for STD screen ? ?PLAN: ?Self-collected vaginal probe for Gonorrhea, Chlamydia, Trichomonas, Bacterial Vaginosis, Yeast sent to lab ?Treatment: to be determined once results are received ?Follow-up as needed if symptoms persist/worsen, or new symptoms develop ? ?Annamarie Dawley  ?01/17/2022 ?3:47 PM  ?

## 2022-01-19 LAB — CERVICOVAGINAL ANCILLARY ONLY
Bacterial Vaginitis (gardnerella): POSITIVE — AB
Candida Glabrata: NEGATIVE
Candida Vaginitis: POSITIVE — AB
Chlamydia: POSITIVE — AB
Comment: NEGATIVE
Comment: NEGATIVE
Comment: NEGATIVE
Comment: NEGATIVE
Comment: NEGATIVE
Comment: NORMAL
Neisseria Gonorrhea: NEGATIVE
Trichomonas: NEGATIVE

## 2022-01-22 ENCOUNTER — Encounter: Payer: Self-pay | Admitting: Adult Health

## 2022-01-22 ENCOUNTER — Other Ambulatory Visit: Payer: Self-pay | Admitting: Adult Health

## 2022-01-22 DIAGNOSIS — A749 Chlamydial infection, unspecified: Secondary | ICD-10-CM

## 2022-01-22 HISTORY — DX: Chlamydial infection, unspecified: A74.9

## 2022-01-22 MED ORDER — METRONIDAZOLE 500 MG PO TABS: 500.0000 mg | ORAL_TABLET | Freq: Two times a day (BID) | ORAL | 0 refills | Status: DC

## 2022-01-22 MED ORDER — FLUCONAZOLE 150 MG PO TABS: ORAL_TABLET | ORAL | 1 refills | Status: DC

## 2022-01-22 MED ORDER — DOXYCYCLINE HYCLATE 100 MG PO TABS: 100.0000 mg | ORAL_TABLET | Freq: Two times a day (BID) | ORAL | 0 refills | Status: DC

## 2022-01-22 NOTE — Progress Notes (Signed)
Has BV,yeast and chlamydia on vaginal swab will treat with flagyl,diflucan and doxycycline, no sex during treatment or alcohol and partner needs to be treated for chlamydia. He can see health dept or primary care doctor or I can treat, will need name allergies, dob and drug store. Zaleski sent ?

## 2022-02-12 ENCOUNTER — Other Ambulatory Visit (INDEPENDENT_AMBULATORY_CARE_PROVIDER_SITE_OTHER): Payer: Medicaid Other

## 2022-02-12 ENCOUNTER — Other Ambulatory Visit (HOSPITAL_COMMUNITY)
Admission: RE | Admit: 2022-02-12 | Discharge: 2022-02-12 | Disposition: A | Discharge status: Home / Self Care | Payer: Medicaid Other | Source: Ambulatory Visit | Attending: Obstetrics & Gynecology | Admitting: Obstetrics & Gynecology

## 2022-02-12 DIAGNOSIS — N898 Other specified noninflammatory disorders of vagina: Secondary | ICD-10-CM

## 2022-02-12 DIAGNOSIS — N939 Abnormal uterine and vaginal bleeding, unspecified: Secondary | ICD-10-CM | POA: Diagnosis not present

## 2022-02-12 NOTE — Progress Notes (Signed)
? ?  NURSE VISIT- VAGINITIS/STD ? ?SUBJECTIVE:  ?Shelia Brown is a 31 y.o. [id number] GYN patientfemale here for a vaginal swab for vaginitis screening, STD screen.  She reports the following symptoms: abnormal bleeding: flow is light for 3 weeks. ?Denies abnormal vaginal bleeding, significant pelvic pain, fever, or UTI symptoms. ? ?OBJECTIVE:  ?There were no vitals taken for this visit.  ?Appears well, in no apparent distress ? ?ASSESSMENT: ?Vaginal swab for  vaginitis & STD screening ? ?PLAN: ?Self-collected vaginal probe for Gonorrhea, Chlamydia, Trichomonas, Bacterial Vaginosis, Yeast sent to lab ?Treatment: to be determined once results are received ?Follow-up as needed if symptoms persist/worsen, or new symptoms develop ? ?Euva Rundell A Tanyla Stege  ?02/12/2022 ?3:12 PM  ?

## 2022-02-14 LAB — CERVICOVAGINAL ANCILLARY ONLY
Bacterial Vaginitis (gardnerella): NEGATIVE
Candida Glabrata: NEGATIVE
Candida Vaginitis: NEGATIVE
Chlamydia: NEGATIVE
Comment: NEGATIVE
Comment: NEGATIVE
Comment: NEGATIVE
Comment: NEGATIVE
Comment: NEGATIVE
Comment: NORMAL
Neisseria Gonorrhea: NEGATIVE
Trichomonas: NEGATIVE

## 2022-03-20 DIAGNOSIS — Z7251 High risk heterosexual behavior: Secondary | ICD-10-CM | POA: Diagnosis not present

## 2022-03-20 DIAGNOSIS — Z3202 Encounter for pregnancy test, result negative: Secondary | ICD-10-CM | POA: Diagnosis not present

## 2022-03-20 DIAGNOSIS — R82998 Other abnormal findings in urine: Secondary | ICD-10-CM | POA: Diagnosis not present

## 2022-03-20 DIAGNOSIS — N898 Other specified noninflammatory disorders of vagina: Secondary | ICD-10-CM | POA: Diagnosis not present

## 2022-03-21 ENCOUNTER — Ambulatory Visit: Payer: Medicaid Other

## 2022-03-26 ENCOUNTER — Ambulatory Visit: Payer: Medicaid Other

## 2022-03-28 ENCOUNTER — Ambulatory Visit (INDEPENDENT_AMBULATORY_CARE_PROVIDER_SITE_OTHER): Payer: Medicaid Other | Admitting: *Deleted

## 2022-03-28 DIAGNOSIS — Z3042 Encounter for surveillance of injectable contraceptive: Secondary | ICD-10-CM

## 2022-03-28 MED ORDER — MEDROXYPROGESTERONE ACETATE 150 MG/ML IM SUSP
150.0000 mg | Freq: Once | INTRAMUSCULAR | Status: AC
  Administered 2022-03-28: 150 mg via INTRAMUSCULAR

## 2022-03-28 NOTE — Progress Notes (Signed)
   NURSE VISIT- INJECTION  SUBJECTIVE:  Shelia Brown is a 31 y.o. G22P2002 female here for a Depo Provera for contraception/period management. She is a GYN patient.   OBJECTIVE:  There were no vitals taken for this visit.  Appears well, in no apparent distress  Injection administered in: Right deltoid  Meds ordered this encounter  Medications   medroxyPROGESTERone (DEPO-PROVERA) injection 150 mg    ASSESSMENT: GYN patient Depo Provera for contraception/period management PLAN: Follow-up: in 11-13 weeks for next Depo   Levy Pupa  03/28/2022 4:02 PM

## 2022-04-03 DIAGNOSIS — L209 Atopic dermatitis, unspecified: Secondary | ICD-10-CM | POA: Diagnosis not present

## 2022-06-06 DIAGNOSIS — N939 Abnormal uterine and vaginal bleeding, unspecified: Secondary | ICD-10-CM | POA: Diagnosis not present

## 2022-06-06 DIAGNOSIS — Z8619 Personal history of other infectious and parasitic diseases: Secondary | ICD-10-CM | POA: Diagnosis not present

## 2022-06-06 DIAGNOSIS — Z3202 Encounter for pregnancy test, result negative: Secondary | ICD-10-CM | POA: Diagnosis not present

## 2022-06-11 ENCOUNTER — Other Ambulatory Visit: Payer: Self-pay | Admitting: Adult Health

## 2022-06-11 ENCOUNTER — Encounter: Payer: Self-pay | Admitting: Obstetrics & Gynecology

## 2022-06-11 MED ORDER — MEGESTROL ACETATE 40 MG PO TABS
ORAL_TABLET | ORAL | 1 refills | Status: DC
Start: 2022-06-11 — End: 2022-08-23

## 2022-06-11 NOTE — Progress Notes (Unsigned)
Will rx megace 

## 2022-06-20 ENCOUNTER — Ambulatory Visit (INDEPENDENT_AMBULATORY_CARE_PROVIDER_SITE_OTHER): Payer: Medicaid Other | Admitting: *Deleted

## 2022-06-20 DIAGNOSIS — Z3042 Encounter for surveillance of injectable contraceptive: Secondary | ICD-10-CM | POA: Diagnosis not present

## 2022-06-20 MED ORDER — MEDROXYPROGESTERONE ACETATE 150 MG/ML IM SUSP
150.0000 mg | Freq: Once | INTRAMUSCULAR | Status: AC
  Administered 2022-06-20: 150 mg via INTRAMUSCULAR

## 2022-06-20 NOTE — Progress Notes (Signed)
   NURSE VISIT- INJECTION  SUBJECTIVE:  Shelia Brown is a 31 y.o. G44P2002 female here for a Depo Provera for contraception/period management. She is a GYN patient.   OBJECTIVE:  There were no vitals taken for this visit.  Appears well, in no apparent distress  Injection administered in: Left deltoid  No orders of the defined types were placed in this encounter.   ASSESSMENT: GYN patient Depo Provera for contraception/period management PLAN: Follow-up: in 11-13 weeks for next Depo   Jobe Marker  06/20/2022 4:23 PM

## 2022-07-12 ENCOUNTER — Encounter: Payer: Self-pay | Admitting: Obstetrics & Gynecology

## 2022-07-12 ENCOUNTER — Ambulatory Visit (INDEPENDENT_AMBULATORY_CARE_PROVIDER_SITE_OTHER): Payer: Medicaid Other | Admitting: Obstetrics & Gynecology

## 2022-07-12 ENCOUNTER — Other Ambulatory Visit (HOSPITAL_COMMUNITY)
Admission: RE | Admit: 2022-07-12 | Discharge: 2022-07-12 | Disposition: A | Discharge status: Home / Self Care | Payer: Medicaid Other | Source: Ambulatory Visit | Attending: Obstetrics & Gynecology | Admitting: Obstetrics & Gynecology

## 2022-07-12 VITALS — BP 132/77 | HR 75 | Ht 66.75 in | Wt 126.8 lb

## 2022-07-12 DIAGNOSIS — Z113 Encounter for screening for infections with a predominantly sexual mode of transmission: Secondary | ICD-10-CM | POA: Insufficient documentation

## 2022-07-12 DIAGNOSIS — Z3049 Encounter for surveillance of other contraceptives: Secondary | ICD-10-CM | POA: Diagnosis not present

## 2022-07-12 DIAGNOSIS — Z01419 Encounter for gynecological examination (general) (routine) without abnormal findings: Secondary | ICD-10-CM

## 2022-07-12 DIAGNOSIS — Z0000 Encounter for general adult medical examination without abnormal findings: Secondary | ICD-10-CM

## 2022-07-12 MED ORDER — MEDROXYPROGESTERONE ACETATE 150 MG/ML IM SUSP: 150.0000 mg | INTRAMUSCULAR | 4 refills | Status: DC

## 2022-07-12 NOTE — Progress Notes (Signed)
WELL-WOMAN EXAMINATION Patient name: Shelia Brown MRN [id number]  Date of birth: 04/12/1991 Chief Complaint:   Gynecologic Exam  History of Present Illness:   Shelia Brown is a 31 y.o. [id number]  female being seen today for a routine well-woman exam.   Spotting every day since May- currently on Megace to help with the bleeding.  While this medication works, if she stops the Megace then the spotting will restart.  Previously did not have issues.  Denies pelvic or abdominal pain.  Denies vaginal discharge, itching or irritation.   The current method of family planning is Depo-Provera injections.   Last pap 12/2019.  Last mammogram: n/a. Last colonoscopy: n/a     07/12/2022    2:17 PM 05/18/2021    9:30 AM 02/08/2020   10:36 AM 01/07/2020   11:35 AM 09/25/2017   10:11 AM  Depression screen PHQ 2/9  Decreased Interest 0 0 0 0 0  Down, Depressed, Hopeless 0 0 0 0 0  PHQ - 2 Score 0 0 0 0 0  Altered sleeping 0 0   0  Tired, decreased energy 0 0   0  Change in appetite 0 0   0  Feeling bad or failure about yourself  0 0   0  Trouble concentrating 0 0   0  Moving slowly or fidgety/restless 0 0   0  Suicidal thoughts 0 0   0  PHQ-9 Score 0 0   0  Difficult doing work/chores     Not difficult at all      Review of Systems:   Pertinent items are noted in HPI Denies any headaches, blurred vision, fatigue, shortness of breath, chest pain, abdominal pain, bowel movements, urination, or intercourse unless otherwise stated above.  Pertinent History Reviewed:  Reviewed past medical,surgical, social and family history.  Reviewed problem list, medications and allergies. Physical Assessment:   Vitals:   07/12/22 1413  BP: 132/77  Pulse: 75  Weight: 126 lb 12.8 oz (57.5 kg)  Height: 5' 6.75" (1.695 m)  Body mass index is 20.01 kg/m.        Physical Examination:   General appearance - well appearing, and in no distress  Mental status - alert, oriented to person, place, and  time  Psych:  She has a normal mood and affect  Skin - warm and dry, normal color, no suspicious lesions noted  Chest - effort normal, all lung fields clear to auscultation bilaterally  Heart - normal rate and regular rhythm  Neck:  midline trachea, no thyromegaly or nodules  Breasts - breasts appear normal, no suspicious masses, no skin or nipple changes or  axillary nodes.  Bilateral piercings noted- no evidence of infection  Abdomen - soft, nontender, nondistended, no masses or organomegaly  Pelvic - VULVA: normal appearing vulva with no masses, tenderness or lesions  VAGINA: normal appearing vagina with normal color and discharge, no lesions  CERVIX: normal appearing cervix without discharge or lesions, no CMT  UTERUS: uterus is felt to be normal size, shape, consistency and nontender   ADNEXA: No adnexal masses or tenderness noted.  Extremities:  No swelling or varicosities noted  Chaperone:  pt declined      Assessment & Plan:  1) Well-Woman Exam -reviewed ASCCP guidelines, pap up to date 2021- due next year  2) Contraceptive management -continue with Depot -discussed that AUB likely due to shot, reviewed alternative options -plan to continue with Depot for now  3) STI screening -  May HIV/Syphilis completed outside facility -GC/C collected today  Meds:  Meds ordered this encounter  Medications   medroxyPROGESTERone (DEPO-PROVERA) 150 MG/ML injection    Sig: Inject 1 mL (150 mg total) into the muscle every 3 (three) months.    Dispense:  1 mL    Refill:  4    Follow-up: Return in about 1 year (around 07/13/2023) for Annual.   Myna Hidalgo, DO Attending Obstetrician & Gynecologist, [facility], [facility]

## 2022-07-18 LAB — CERVICOVAGINAL ANCILLARY ONLY
Chlamydia: NEGATIVE
Comment: NEGATIVE
Comment: NEGATIVE
Comment: NORMAL
Neisseria Gonorrhea: NEGATIVE
Trichomonas: NEGATIVE

## 2022-08-22 ENCOUNTER — Other Ambulatory Visit: Payer: Self-pay | Admitting: Adult Health

## 2022-09-06 ENCOUNTER — Encounter: Payer: Self-pay | Admitting: Obstetrics & Gynecology

## 2022-09-11 ENCOUNTER — Encounter: Payer: Self-pay | Admitting: Adult Health

## 2022-09-11 ENCOUNTER — Ambulatory Visit: Payer: Medicaid Other | Admitting: Adult Health

## 2022-09-11 VITALS — BP 132/90 | HR 81 | Ht 66.0 in | Wt 128.5 lb

## 2022-09-11 DIAGNOSIS — Z30017 Encounter for initial prescription of implantable subdermal contraceptive: Secondary | ICD-10-CM

## 2022-09-11 DIAGNOSIS — Z3202 Encounter for pregnancy test, result negative: Secondary | ICD-10-CM | POA: Insufficient documentation

## 2022-09-11 LAB — POCT URINE PREGNANCY: Preg Test, Ur: NEGATIVE

## 2022-09-11 MED ORDER — ETONOGESTREL 68 MG ~~LOC~~ IMPL
68.0000 mg | DRUG_IMPLANT | Freq: Once | SUBCUTANEOUS | Status: AC
  Administered 2022-09-11: 68 mg via SUBCUTANEOUS

## 2022-09-11 NOTE — Patient Instructions (Signed)
Use condoms x 2 weeks, keep clean and dry x 24 hours, no heavy lifting, keep steri strips on x 72 hours, Keep pressure dressing on x 24 hours. Follow up prn problems.  

## 2022-09-11 NOTE — Progress Notes (Signed)
  Subjective:     Patient ID: Shelia Brown, female   DOB: [birth year], 31 y.o.   MRN: [id number]  HPI Shelia Brown is a 31 year old blck female,single, G2P2 in for nexplanon insertion, she had been on depo, and had irregular bleeding.depo was due tomorrow.   Last pap was 01/07/20 negative for malignancy and HPV  PCP is Dr Karie Kirks  Review of Systems For nexplanon insertion Reviewed past medical,surgical, social and family history. Reviewed medications and allergies.     Objective:   Physical Exam BP (!) 132/90 (BP Location: Left Arm, Patient Position: Sitting, Cuff Size: Normal)   Pulse 81   Ht 5\' 6"  (1.676 m)   Wt 128 lb 8 oz (58.3 kg)   BMI 20.74 kg/m     UPT is negative Consent signed, time out called. Left arm cleansed with betadine, and injected with 1.5 cc 2% lidocaine and waited til numb. Nexplanon easily inserted and steri strips applied.Rod easily palpated by provider and pt. Pressure dressing applied.   Upstream - 09/11/22 1515       Pregnancy Intention Screening   Does the patient want to become pregnant in the next year? No    Does the patient's partner want to become pregnant in the next year? No    Would the patient like to discuss contraceptive options today? No      Contraception Wrap Up   Current Method Hormonal Injection    End Method Hormonal Implant             Assessment:     1. Pregnancy examination or test, negative result  2. Nexplanon insertion Lot [id number] Use condoms x 2 weeks, keep clean and dry x 24 hours, no heavy lifting, keep steri strips on x 72 hours, Keep pressure dressing on x 24 hours. Follow up prn problems.     Plan:     Return in February for pap

## 2022-09-12 ENCOUNTER — Ambulatory Visit: Payer: Medicaid Other

## 2022-09-24 ENCOUNTER — Other Ambulatory Visit: Payer: Self-pay | Admitting: Adult Health

## 2022-10-01 DIAGNOSIS — M545 Low back pain, unspecified: Secondary | ICD-10-CM | POA: Diagnosis not present

## 2023-02-08 ENCOUNTER — Other Ambulatory Visit: Payer: Self-pay | Admitting: Adult Health

## 2023-03-27 DIAGNOSIS — H60501 Unspecified acute noninfective otitis externa, right ear: Secondary | ICD-10-CM | POA: Diagnosis not present

## 2023-03-27 DIAGNOSIS — H6121 Impacted cerumen, right ear: Secondary | ICD-10-CM | POA: Diagnosis not present

## 2023-06-07 ENCOUNTER — Other Ambulatory Visit: Payer: Self-pay | Admitting: Adult Health

## 2024-02-13 ENCOUNTER — Other Ambulatory Visit: Payer: Self-pay | Admitting: Adult Health

## 2024-03-13 ENCOUNTER — Other Ambulatory Visit: Payer: Self-pay | Admitting: Adult Health

## 2024-03-13 MED ORDER — MEGESTROL ACETATE 40 MG PO TABS: ORAL_TABLET | ORAL | 0 refills | Status: DC

## 2024-03-13 NOTE — Telephone Encounter (Signed)
 Refilled megace

## 2024-04-27 ENCOUNTER — Ambulatory Visit: Admitting: Obstetrics & Gynecology

## 2024-04-27 ENCOUNTER — Encounter: Payer: Self-pay | Admitting: Obstetrics & Gynecology

## 2024-04-27 ENCOUNTER — Other Ambulatory Visit (HOSPITAL_COMMUNITY)
Admission: RE | Admit: 2024-04-27 | Discharge: 2024-04-27 | Disposition: A | Discharge status: Home / Self Care | Source: Ambulatory Visit | Attending: Obstetrics & Gynecology | Admitting: Obstetrics & Gynecology

## 2024-04-27 VITALS — BP 131/79 | HR 69 | Ht 66.0 in | Wt 139.6 lb

## 2024-04-27 DIAGNOSIS — Z3009 Encounter for other general counseling and advice on contraception: Secondary | ICD-10-CM

## 2024-04-27 DIAGNOSIS — N921 Excessive and frequent menstruation with irregular cycle: Secondary | ICD-10-CM

## 2024-04-27 DIAGNOSIS — Z01419 Encounter for gynecological examination (general) (routine) without abnormal findings: Secondary | ICD-10-CM | POA: Diagnosis present

## 2024-04-27 DIAGNOSIS — Z124 Encounter for screening for malignant neoplasm of cervix: Secondary | ICD-10-CM | POA: Diagnosis not present

## 2024-04-27 DIAGNOSIS — Z113 Encounter for screening for infections with a predominantly sexual mode of transmission: Secondary | ICD-10-CM | POA: Insufficient documentation

## 2024-04-27 DIAGNOSIS — Z975 Presence of (intrauterine) contraceptive device: Secondary | ICD-10-CM | POA: Diagnosis not present

## 2024-04-27 MED ORDER — MEGESTROL ACETATE 40 MG PO TABS: ORAL_TABLET | ORAL | 4 refills | Status: DC

## 2024-04-27 NOTE — Progress Notes (Signed)
 WELL-WOMAN EXAMINATION Patient name: Shelia Brown MRN [id number]  Date of birth: 02-Nov-1991 Chief Complaint:   Annual Exam  History of Present Illness:   Shelia Brown is a 33 y.o. [phone]  female being seen today for a routine well-woman exam.   Transitioned from Depot to Nexplanon  due to irregular bleeding; however this is still an issues.  Typically still taking Megace  4-5 x per week to help with the irregular bleeding.  Once she takes the Megace  the bleeding will turn brown then stop.    He does not desire pregnancy at this time and wishes to reconsider her options regarding contraception.  Denies vaginal discharge, itching or irritation.  Denies pelvic or abdominal pain.  Her only concern is the irregular brown spotting and bleeding from her menses   Patient's last menstrual period was 04/25/2024 (approximate). Denies issues with her menses The current method of family planning is Nexplanon  - placed 10/31    Last pap 2021.  Last mammogram: NA. Last colonoscopy: NA     04/27/2024    8:43 AM 07/12/2022    2:17 PM 05/18/2021    9:30 AM 02/08/2020   10:36 AM 01/07/2020   11:35 AM  Depression screen PHQ 2/9  Decreased Interest 0 0 0 0 0  Down, Depressed, Hopeless 0 0 0 0 0  PHQ - 2 Score 0 0 0 0 0  Altered sleeping 0 0 0    Tired, decreased energy 0 0 0    Change in appetite 0 0 0    Feeling bad or failure about yourself  0 0 0    Trouble concentrating 0 0 0    Moving slowly or fidgety/restless 0 0 0    Suicidal thoughts 0 0 0    PHQ-9 Score 0 0 0        Review of Systems:   Pertinent items are noted in HPI Denies any headaches, blurred vision, fatigue, shortness of breath, chest pain, abdominal pain, bowel movements, urination, or intercourse unless otherwise stated above.  Pertinent History Reviewed:  Reviewed past medical,surgical, social and family history.  Reviewed problem list, medications and allergies. Physical Assessment:   Vitals:   04/27/24 0835   BP: 131/79  Pulse: 69  Weight: 139 lb 9.6 oz (63.3 kg)  Height: 5' 6 (1.676 m)  Body mass index is 22.53 kg/m.        Physical Examination:   General appearance - well appearing, and in no distress  Mental status - alert, oriented to person, place, and time  Psych:  She has a normal mood and affect  Skin - warm and dry, normal color, no suspicious lesions noted  Chest - effort normal, all lung fields clear to auscultation bilaterally  Heart - normal rate and regular rhythm  Neck:  midline trachea, no thyromegaly or nodules  Breasts - breasts appear normal, no suspicious masses, no skin or nipple changes or  axillary nodes  Abdomen - soft, nontender, nondistended, no masses or organomegaly  Pelvic - VULVA: normal appearing vulva with no masses, tenderness or lesions  VAGINA: normal appearing vagina with normal color and discharge, no lesions  CERVIX: normal appearing cervix without discharge or lesions, no CMT  Thin prep pap is done with HR HPV cotesting  UTERUS: uterus is felt to be normal size, shape, consistency and nontender   ADNEXA: No adnexal masses or tenderness noted.  Extremities:  No swelling or varicosities noted.  Left arm with palpable Nexplanon   Chaperone:  Wendell Halt     Assessment & Plan:  1) Well-Woman Exam -pap collected along with STI screening, reviewed screening guidelines  2) Contraceptive management - Patient has already tried pills, Depo and currently has a Nexplanon  which requires her to take Megace  fairly regularly - Discussed alternative options including returning to one of the above or consideration for IUD or permanent sterilization - Questions and concerns were addressed we will plan to proceed with IUD insertion at next available  3) AUB due to Nexplanon  -continue with Megace  until change in contraception  No orders of the defined types were placed in this encounter.   Meds: No orders of the defined types were placed in this  encounter.   Follow-up: Return for next available- Nexplanon  removal/IUD insertion.   Taytum Scheck, DO Attending Obstetrician & Gynecologist, [facility] for [employer], [facility]

## 2024-04-29 ENCOUNTER — Ambulatory Visit: Payer: Self-pay | Admitting: Obstetrics & Gynecology

## 2024-04-29 LAB — CYTOLOGY - PAP
Chlamydia: NEGATIVE
Comment: NEGATIVE
Comment: NEGATIVE
Comment: NEGATIVE
Comment: NEGATIVE
Comment: NEGATIVE
Comment: NORMAL
Diagnosis: NEGATIVE
HPV 16: NEGATIVE
HPV 18 / 45: NEGATIVE
High risk HPV: POSITIVE — AB
Neisseria Gonorrhea: NEGATIVE
Trichomonas: NEGATIVE

## 2024-05-11 ENCOUNTER — Other Ambulatory Visit: Payer: Self-pay | Admitting: *Deleted

## 2024-05-11 ENCOUNTER — Encounter: Payer: Self-pay | Admitting: Obstetrics & Gynecology

## 2024-05-14 ENCOUNTER — Other Ambulatory Visit: Payer: Self-pay | Admitting: *Deleted

## 2024-05-14 MED ORDER — MEGESTROL ACETATE 20 MG PO TABS: 40.0000 mg | ORAL_TABLET | Freq: Every day | ORAL | 3 refills | Status: AC

## 2024-06-08 ENCOUNTER — Encounter: Admitting: Obstetrics & Gynecology

## 2024-07-13 ENCOUNTER — Encounter: Payer: Self-pay | Admitting: Obstetrics & Gynecology

## 2024-07-14 MED ORDER — MEGESTROL ACETATE 40 MG/ML PO SUSP: 40.0000 mg | Freq: Every day | ORAL | 11 refills | Status: AC
